# Patient Record
Sex: Female | Born: 1950 | Race: Black or African American | Hispanic: No | Marital: Single | Smoking: Never smoker
Health system: Southern US, Community
[De-identification: ages and names within clinical notes are randomized; demographics above are authoritative.]

## PROBLEM LIST (undated history)

## (undated) DIAGNOSIS — M5136 Other intervertebral disc degeneration, lumbar region: Secondary | ICD-10-CM

## (undated) DIAGNOSIS — R7989 Other specified abnormal findings of blood chemistry: Secondary | ICD-10-CM

## (undated) DIAGNOSIS — M51369 Other intervertebral disc degeneration, lumbar region without mention of lumbar back pain or lower extremity pain: Secondary | ICD-10-CM

## (undated) HISTORY — DX: Other specified abnormal findings of blood chemistry: R79.89

## (undated) HISTORY — PX: COLONOSCOPY: SHX174

## (undated) HISTORY — DX: Other intervertebral disc degeneration, lumbar region: M51.36

## (undated) HISTORY — DX: Other intervertebral disc degeneration, lumbar region without mention of lumbar back pain or lower extremity pain: M51.369

---

## 2001-02-14 ENCOUNTER — Encounter: Payer: Self-pay | Admitting: Family Medicine

## 2001-02-14 ENCOUNTER — Ambulatory Visit: Admission: RE | Admit: 2001-02-14 | Discharge: 2001-02-14 | Payer: Self-pay | Admitting: Family Medicine

## 2001-08-09 ENCOUNTER — Other Ambulatory Visit: Admission: RE | Admit: 2001-08-09 | Discharge: 2001-08-09 | Payer: Self-pay | Admitting: Family Medicine

## 2002-12-18 ENCOUNTER — Other Ambulatory Visit: Admission: RE | Admit: 2002-12-18 | Discharge: 2002-12-18 | Payer: Self-pay | Admitting: Family Medicine

## 2002-12-25 ENCOUNTER — Encounter: Admission: RE | Admit: 2002-12-25 | Discharge: 2002-12-25 | Payer: Self-pay | Admitting: Family Medicine

## 2002-12-25 ENCOUNTER — Encounter: Payer: Self-pay | Admitting: Family Medicine

## 2003-01-22 ENCOUNTER — Encounter: Payer: Self-pay | Admitting: Neurosurgery

## 2003-01-22 ENCOUNTER — Encounter: Admission: RE | Admit: 2003-01-22 | Discharge: 2003-01-22 | Payer: Self-pay | Admitting: Neurosurgery

## 2003-01-22 ENCOUNTER — Encounter: Payer: Self-pay | Admitting: Radiology

## 2003-02-13 ENCOUNTER — Encounter: Admission: RE | Admit: 2003-02-13 | Discharge: 2003-02-13 | Payer: Self-pay | Admitting: Neurosurgery

## 2003-02-13 ENCOUNTER — Encounter: Payer: Self-pay | Admitting: Neurosurgery

## 2004-07-19 ENCOUNTER — Other Ambulatory Visit: Admission: RE | Admit: 2004-07-19 | Discharge: 2004-07-19 | Payer: Self-pay | Admitting: Family Medicine

## 2004-07-19 ENCOUNTER — Ambulatory Visit: Payer: Self-pay | Admitting: Family Medicine

## 2004-08-08 ENCOUNTER — Ambulatory Visit: Payer: Self-pay | Admitting: Family Medicine

## 2004-12-16 ENCOUNTER — Ambulatory Visit: Payer: Self-pay | Admitting: Family Medicine

## 2005-07-04 ENCOUNTER — Ambulatory Visit: Payer: Self-pay | Admitting: Family Medicine

## 2006-04-17 ENCOUNTER — Other Ambulatory Visit: Admission: RE | Admit: 2006-04-17 | Discharge: 2006-04-17 | Payer: Self-pay | Admitting: *Deleted

## 2006-06-13 ENCOUNTER — Encounter: Admission: RE | Admit: 2006-06-13 | Discharge: 2006-06-13 | Payer: Self-pay | Admitting: *Deleted

## 2007-11-11 ENCOUNTER — Encounter: Admission: RE | Admit: 2007-11-11 | Discharge: 2007-11-11 | Payer: Self-pay | Admitting: *Deleted

## 2008-11-24 ENCOUNTER — Other Ambulatory Visit: Admission: RE | Admit: 2008-11-24 | Discharge: 2008-11-24 | Payer: Self-pay | Admitting: Family Medicine

## 2008-12-02 ENCOUNTER — Encounter: Admission: RE | Admit: 2008-12-02 | Discharge: 2008-12-02 | Payer: Self-pay | Admitting: Family Medicine

## 2010-05-29 ENCOUNTER — Encounter: Payer: Self-pay | Admitting: *Deleted

## 2010-11-24 ENCOUNTER — Other Ambulatory Visit (HOSPITAL_COMMUNITY)
Admission: RE | Admit: 2010-11-24 | Discharge: 2010-11-24 | Disposition: A | Discharge status: Home / Self Care | Payer: BC Managed Care – PPO | Source: Ambulatory Visit | Attending: Family Medicine | Admitting: Family Medicine

## 2010-11-24 ENCOUNTER — Other Ambulatory Visit: Payer: Self-pay

## 2010-11-24 DIAGNOSIS — Z01419 Encounter for gynecological examination (general) (routine) without abnormal findings: Secondary | ICD-10-CM | POA: Insufficient documentation

## 2010-11-28 ENCOUNTER — Other Ambulatory Visit: Payer: Self-pay | Admitting: Family Medicine

## 2010-11-28 DIAGNOSIS — Z780 Asymptomatic menopausal state: Secondary | ICD-10-CM

## 2010-11-28 DIAGNOSIS — Z1231 Encounter for screening mammogram for malignant neoplasm of breast: Secondary | ICD-10-CM

## 2015-10-05 ENCOUNTER — Other Ambulatory Visit (HOSPITAL_COMMUNITY)
Admission: RE | Admit: 2015-10-05 | Discharge: 2015-10-05 | Disposition: A | Discharge status: Home / Self Care | Payer: Managed Care, Other (non HMO) | Source: Ambulatory Visit | Attending: Family Medicine | Admitting: Family Medicine

## 2015-10-05 ENCOUNTER — Other Ambulatory Visit: Payer: Self-pay | Admitting: Family Medicine

## 2015-10-05 DIAGNOSIS — Z01419 Encounter for gynecological examination (general) (routine) without abnormal findings: Secondary | ICD-10-CM | POA: Diagnosis not present

## 2015-10-06 ENCOUNTER — Other Ambulatory Visit: Payer: Self-pay | Admitting: Family Medicine

## 2015-10-06 DIAGNOSIS — E2839 Other primary ovarian failure: Secondary | ICD-10-CM

## 2015-10-07 ENCOUNTER — Other Ambulatory Visit: Payer: Self-pay | Admitting: Family Medicine

## 2015-10-07 DIAGNOSIS — Z1231 Encounter for screening mammogram for malignant neoplasm of breast: Secondary | ICD-10-CM

## 2015-10-07 LAB — CYTOLOGY - PAP

## 2015-10-22 ENCOUNTER — Ambulatory Visit
Admission: RE | Admit: 2015-10-22 | Discharge: 2015-10-22 | Disposition: A | Discharge status: Home / Self Care | Payer: Managed Care, Other (non HMO) | Source: Ambulatory Visit | Attending: Family Medicine | Admitting: Family Medicine

## 2015-10-22 DIAGNOSIS — Z1231 Encounter for screening mammogram for malignant neoplasm of breast: Secondary | ICD-10-CM

## 2015-10-22 DIAGNOSIS — E2839 Other primary ovarian failure: Secondary | ICD-10-CM

## 2017-06-20 IMAGING — MG DIGITAL SCREENING BILATERAL MAMMOGRAM WITH CAD
4 series · 4 of 4 positions shown · non-contrast
Comparison: Previous exam(s).

CLINICAL DATA: Screening.

EXAM:
DIGITAL SCREENING BILATERAL MAMMOGRAM WITH CAD

[R MLO]
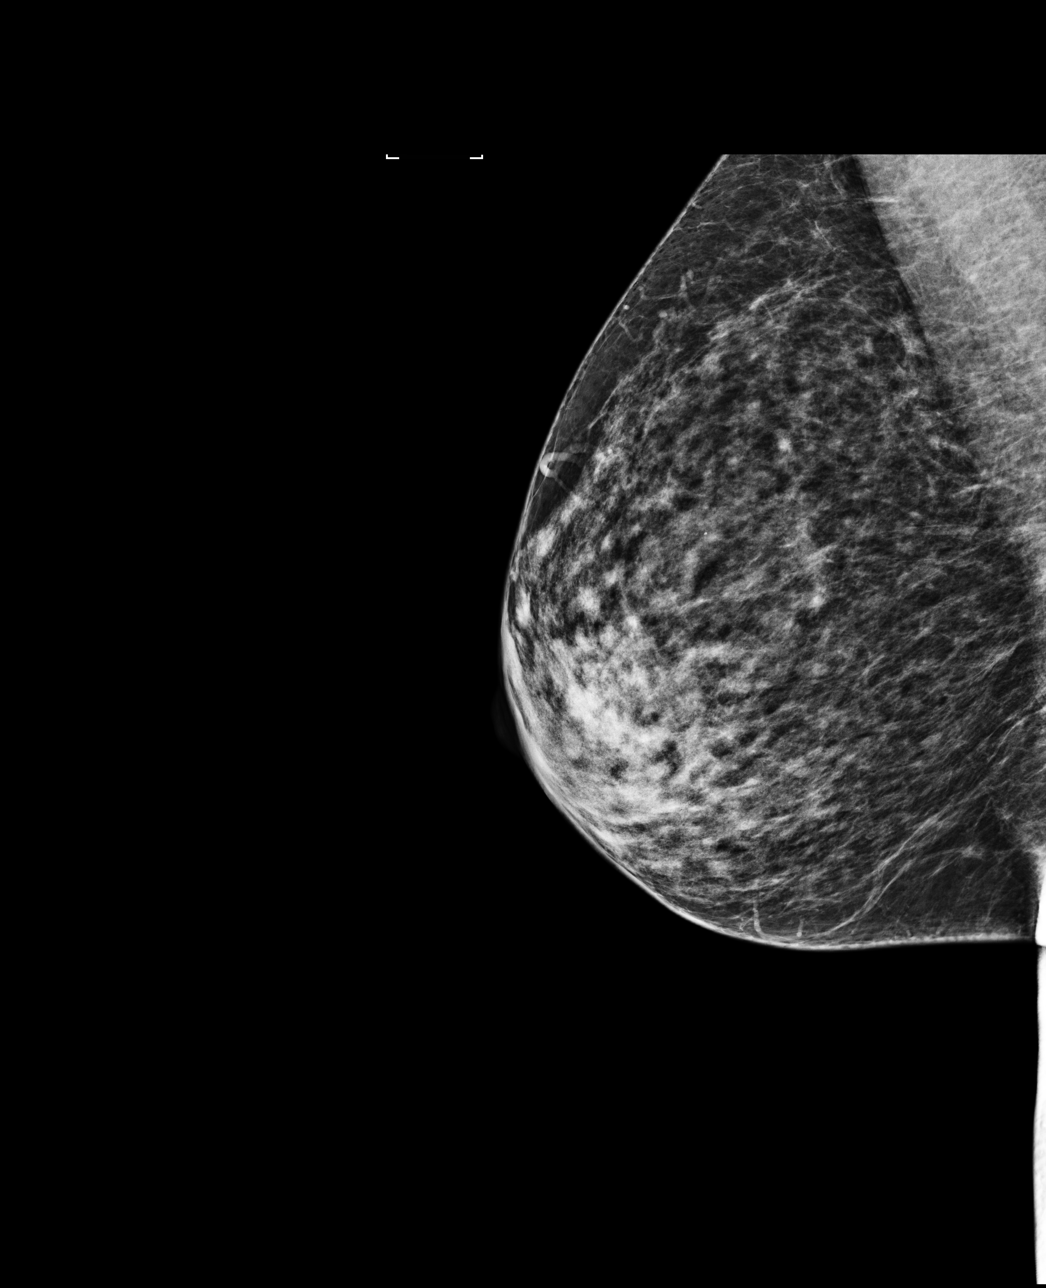

[L MLO]
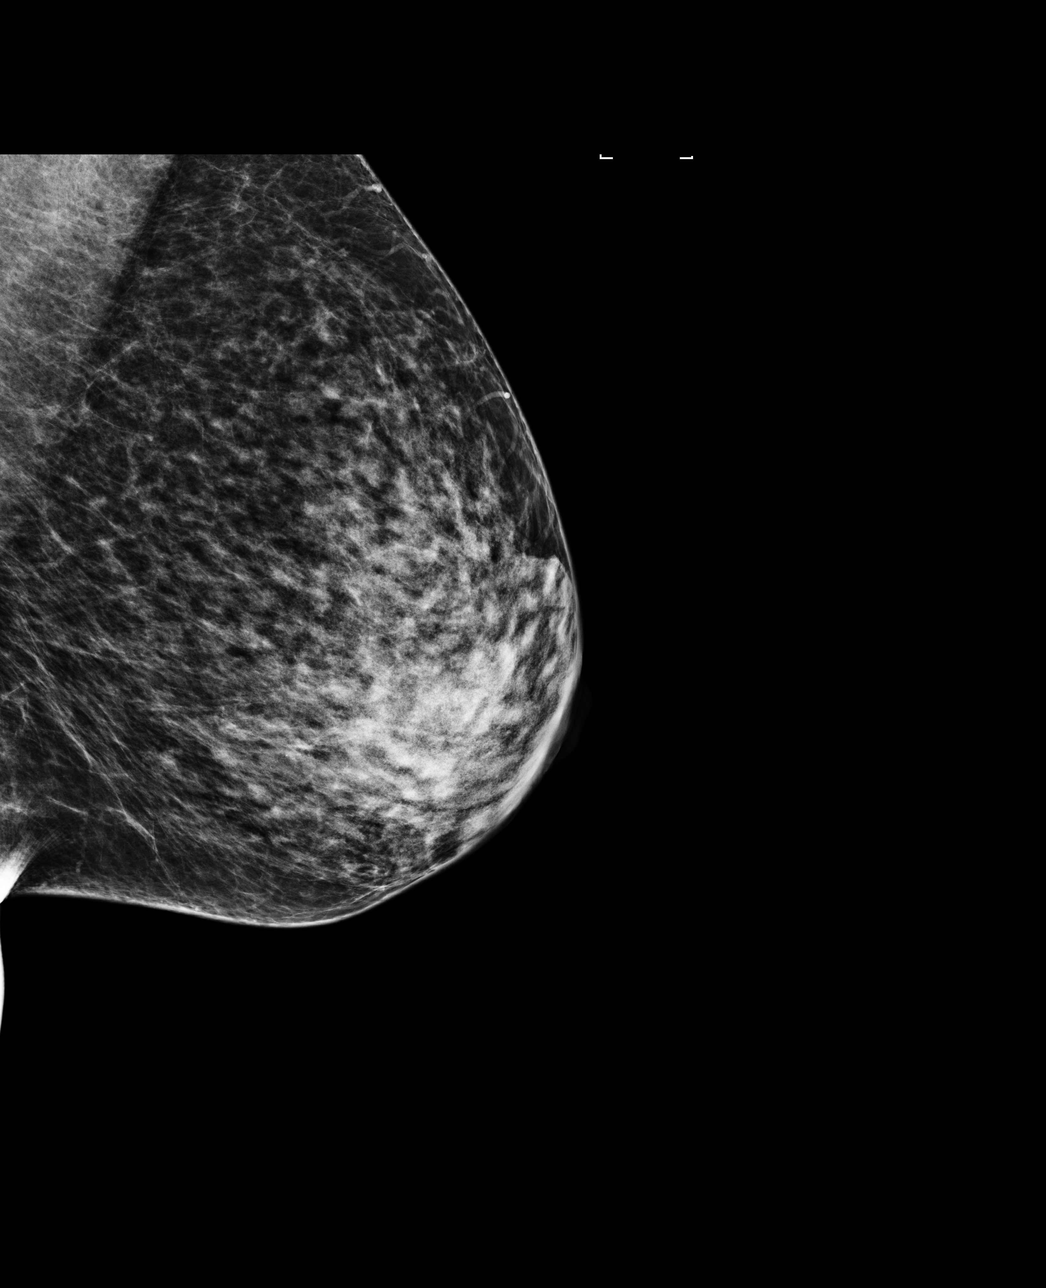

[R CC]
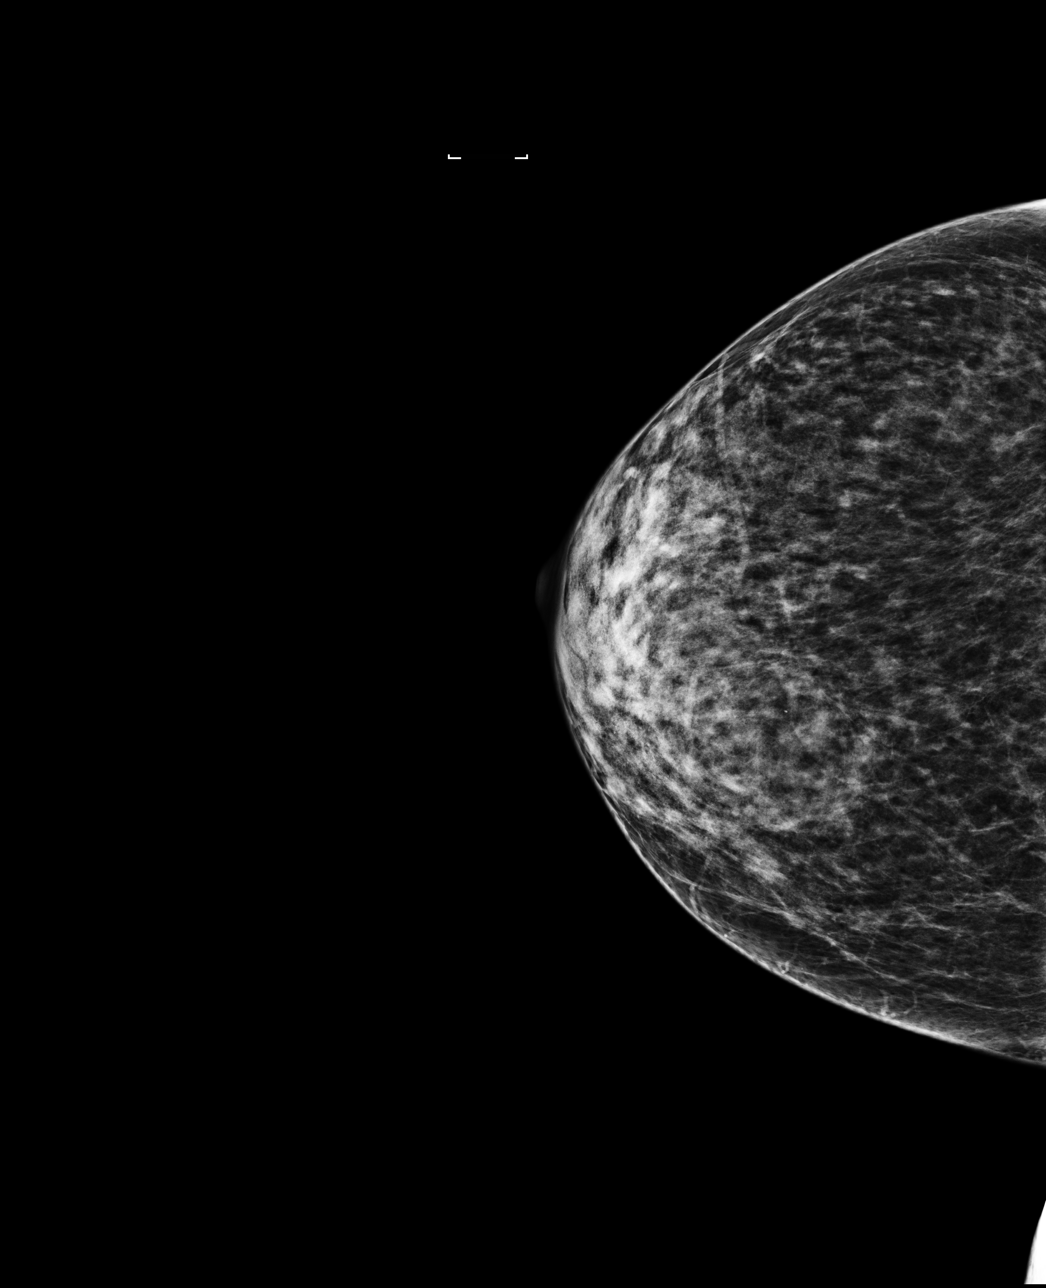

[L CC]
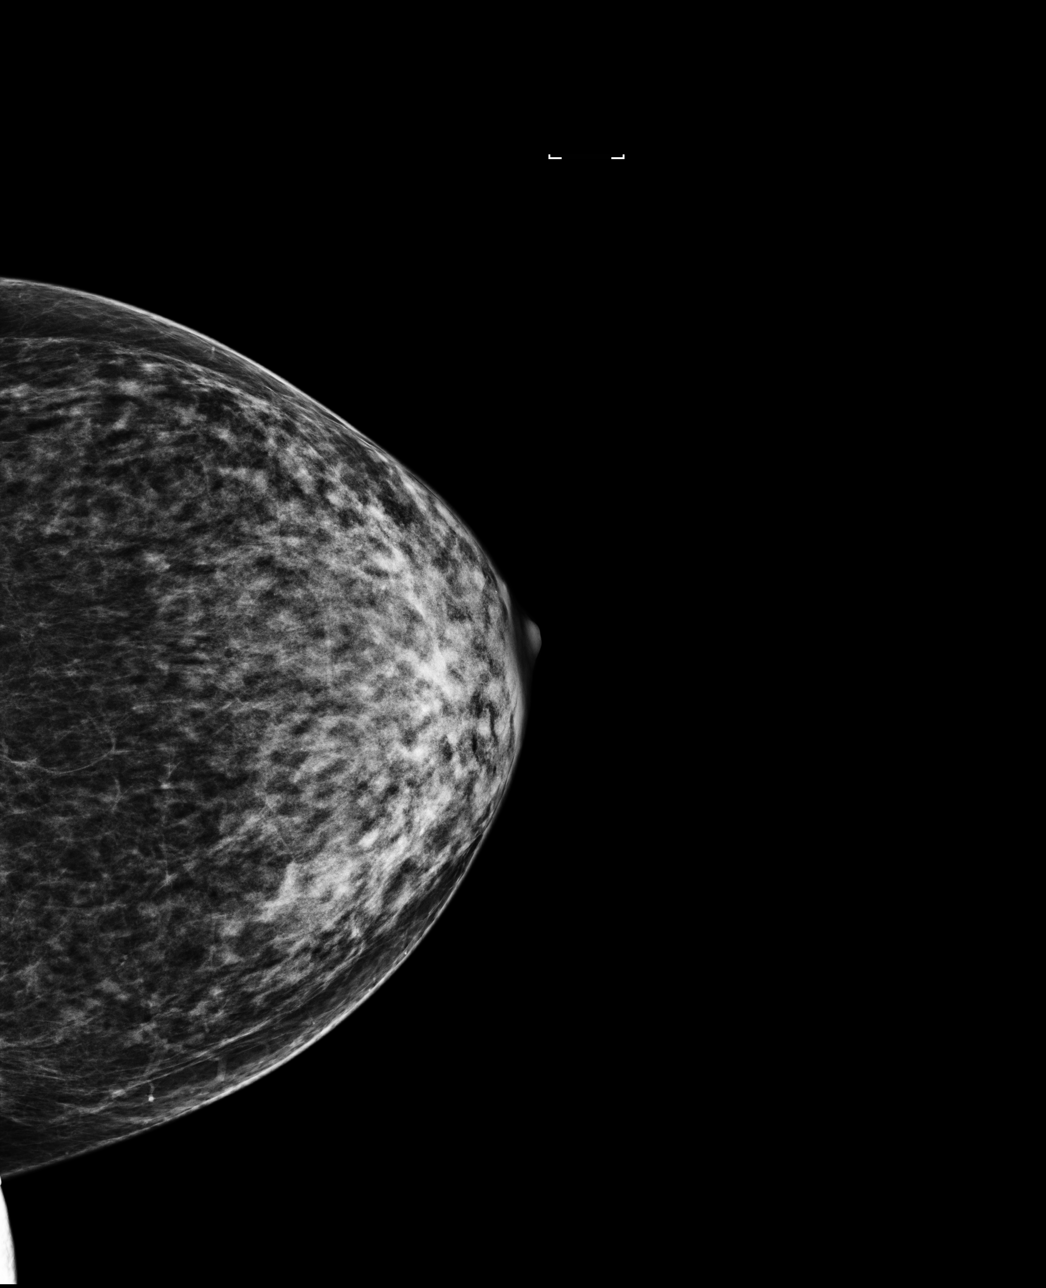

[4 of 4 positions shown; findings below may reference images not displayed]

ACR Breast Density Category c: The breast tissue is heterogeneously
dense, which may obscure small masses.
FINDINGS: There are no findings suspicious for malignancy. Images were
processed with CAD.
IMPRESSION: No mammographic evidence of malignancy. A result letter of this
screening mammogram will be mailed directly to the patient.

RECOMMENDATION:
Screening mammogram in one year. (Code:[id number])

BI-RADS CATEGORY  1: Negative.

## 2019-07-04 ENCOUNTER — Ambulatory Visit: Payer: Medicare Other | Attending: Internal Medicine

## 2019-07-04 DIAGNOSIS — Z23 Encounter for immunization: Secondary | ICD-10-CM | POA: Insufficient documentation

## 2019-07-04 NOTE — Progress Notes (Signed)
   Covid-19 Vaccination Clinic  Name:  Shelly Arnold    MRN: [id number] DOB: Dec 05, 1950  07/04/2019  [name] was observed post Covid-19 immunization for 15 minutes without incidence. She was provided with Vaccine Information Sheet and instruction to access the V-Safe system.   [name] was instructed to call 911 with any severe reactions post vaccine: Marland Kitchen Difficulty breathing  . Swelling of your face and throat  . A fast heartbeat  . A bad rash all over your body  . Dizziness and weakness    Immunizations Administered    Name Date Dose VIS Date Route   Pfizer COVID-19 Vaccine 07/04/2019  3:57 PM 0.3 mL 04/18/2019 Intramuscular   Manufacturer: [employer], [employer]   Lot: [id number]   NDC: [address]-6

## 2019-07-30 ENCOUNTER — Ambulatory Visit: Payer: Medicare Other | Attending: Internal Medicine

## 2019-07-30 DIAGNOSIS — Z23 Encounter for immunization: Secondary | ICD-10-CM

## 2019-07-30 NOTE — Progress Notes (Signed)
   Covid-19 Vaccination Clinic  Name:  Shelly Arnold    MRN: [id number] DOB: 10/17/1950  07/30/2019  [name] was observed post Covid-19 immunization for 15 minutes without incident. She was provided with Vaccine Information Sheet and instruction to access the V-Safe system.   [name] was instructed to call 911 with any severe reactions post vaccine: Marland Kitchen Difficulty breathing  . Swelling of face and throat  . A fast heartbeat  . A bad rash all over body  . Dizziness and weakness   Immunizations Administered    Name Date Dose VIS Date Route   Pfizer COVID-19 Vaccine 07/30/2019 10:18 AM 0.3 mL 04/18/2019 Intramuscular   Manufacturer: [employer], [employer]   Lot: [id number]   NDC: [address]-3

## 2019-08-05 ENCOUNTER — Other Ambulatory Visit: Payer: Self-pay | Admitting: Family Medicine

## 2019-08-05 DIAGNOSIS — Z1231 Encounter for screening mammogram for malignant neoplasm of breast: Secondary | ICD-10-CM

## 2019-09-10 ENCOUNTER — Other Ambulatory Visit: Payer: Self-pay

## 2019-09-10 ENCOUNTER — Ambulatory Visit
Admission: RE | Admit: 2019-09-10 | Discharge: 2019-09-10 | Disposition: A | Discharge status: Home / Self Care | Payer: Medicare Other | Source: Ambulatory Visit | Attending: Family Medicine | Admitting: Family Medicine

## 2019-09-10 DIAGNOSIS — Z1231 Encounter for screening mammogram for malignant neoplasm of breast: Secondary | ICD-10-CM

## 2019-09-23 ENCOUNTER — Other Ambulatory Visit: Payer: Self-pay

## 2019-09-23 ENCOUNTER — Encounter: Payer: Self-pay | Admitting: Internal Medicine

## 2019-09-23 ENCOUNTER — Ambulatory Visit (INDEPENDENT_AMBULATORY_CARE_PROVIDER_SITE_OTHER): Payer: Medicare Other | Admitting: Internal Medicine

## 2019-09-23 VITALS — BP 150/70 | HR 75 | Temp 97.4°F | Ht 62.5 in | Wt 135.7 lb

## 2019-09-23 DIAGNOSIS — Z1211 Encounter for screening for malignant neoplasm of colon: Secondary | ICD-10-CM | POA: Diagnosis not present

## 2019-09-23 DIAGNOSIS — R030 Elevated blood-pressure reading, without diagnosis of hypertension: Secondary | ICD-10-CM | POA: Diagnosis not present

## 2019-09-23 DIAGNOSIS — M5136 Other intervertebral disc degeneration, lumbar region: Secondary | ICD-10-CM | POA: Diagnosis not present

## 2019-09-23 DIAGNOSIS — M51369 Other intervertebral disc degeneration, lumbar region without mention of lumbar back pain or lower extremity pain: Secondary | ICD-10-CM | POA: Insufficient documentation

## 2019-09-23 NOTE — Patient Instructions (Signed)
-  Nice seeing you today!!  -Schedule next available appointment for your physical. Please come in fasting that day.

## 2019-09-23 NOTE — Progress Notes (Signed)
Established Patient Office Visit     This visit occurred during the SARS-CoV-2 public health emergency.  Safety protocols were in place, including screening questions prior to the visit, additional usage of staff PPE, and extensive cleaning of exam room while observing appropriate contact time as indicated for disinfecting solutions.    CC/Reason for Visit: Establish care, discuss chronic conditions  HPI: Shelly Arnold is a 69 y.o. female who is coming in today for the above mentioned reasons. 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  She also has a family history of diabetes.  She does not take any medications chronically, she has no known drug allergies, has not had any prior surgeries.  Is a never drinker, never smoker.  Family history significant for mother, maternal grandfather and several paternal aunts and uncles with diabetes as well as several cousins.  She used to see a physician with [facility].  She has had both of her Covid vaccinations.   Past Medical/Surgical History: Past Medical History:  Diagnosis Date  . DDD (degenerative disc disease), lumbar     History reviewed. No pertinent surgical history.  Social History:  reports that she has never smoked. She has never used smokeless tobacco. She reports previous alcohol use. She reports that she does not use drugs.  Allergies: Not on File  Family History:  Family History  Problem Relation Age of Onset  . Diabetes Mother   . Diabetes Maternal Grandfather   . Diabetes Paternal Uncle      Current Outpatient Medications:  .  calcium gluconate 500 MG tablet, Take 1 tablet by mouth daily., Disp: , Rfl:  .  Cyanocobalamin (B-12) 3000 MCG CAPS, Take by mouth., Disp: , Rfl:   Review of Systems:  Constitutional: Denies fever, chills, diaphoresis, appetite change and fatigue.  HEENT: Denies photophobia, eye pain, redness,  hearing loss, ear pain, congestion, sore throat, rhinorrhea, sneezing, mouth sores, trouble swallowing, neck pain, neck stiffness and tinnitus.   Respiratory: Denies SOB, DOE, cough, chest tightness,  and wheezing.   Cardiovascular: Denies chest pain, palpitations and leg swelling.  Gastrointestinal: Denies nausea, vomiting, abdominal pain, diarrhea, constipation, blood in stool and abdominal distention.  Genitourinary: Denies dysuria, urgency, frequency, hematuria, flank pain and difficulty urinating.  Endocrine: Denies: hot or cold intolerance, sweats, changes in hair or nails, polyuria, polydipsia. Musculoskeletal: Denies myalgias, back pain, joint swelling, arthralgias and gait problem.  Skin: Denies pallor, rash and wound.  Neurological: Denies dizziness, seizures, syncope, weakness, light-headedness, numbness and headaches.  Hematological: Denies adenopathy. Easy bruising, personal or family bleeding history  Psychiatric/Behavioral: Denies suicidal ideation, mood changes, confusion, nervousness, sleep disturbance and agitation    Physical Exam: Vitals:   09/23/19 1135  BP: (!) 150/70  Pulse: 75  Temp: (!) 97.4 F (36.3 C)  TempSrc: Temporal  SpO2: 96%  Weight: 135 lb 11.2 oz (61.6 kg)  Height: 5' 2.5" (1.588 m)    Body mass index is 24.42 kg/m.   Constitutional: NAD, calm, comfortable Eyes: PERRL, lids and conjunctivae normal, wears corrective lenses ENMT: Mucous membranes are moist.  Respiratory: clear to auscultation bilaterally, no wheezing, no crackles. Normal respiratory effort. No accessory muscle use.  Cardiovascular: Regular rate and rhythm, no murmurs / rubs / gallops. No extremity edema.  Neurologic: Grossly intact and nonfocal Psychiatric: Normal judgment and insight. Alert and oriented x 3. Normal mood.    Impression and Plan:  Screening for malignant  neoplasm of colon  - Plan: Ambulatory referral to Gastroenterology  DDD (degenerative disc disease),  lumbar -Stable, she has occasional flareups.  Elevated BP without diagnosis of hypertension -Blood pressure elevated to 150/70 today, not known to be hypertensive. -She will do ambulatory blood pressure monitoring and will return for follow-up.    Patient Instructions  -Nice seeing you today!!  -Schedule next available appointment for your physical. Please come in fasting that day.     Chaya Jan, MD [facility] at [facility]

## 2019-09-25 ENCOUNTER — Encounter: Payer: Self-pay | Admitting: Internal Medicine

## 2019-10-02 ENCOUNTER — Encounter: Payer: Self-pay | Admitting: Internal Medicine

## 2019-10-02 ENCOUNTER — Other Ambulatory Visit: Payer: Self-pay

## 2019-10-02 ENCOUNTER — Ambulatory Visit (INDEPENDENT_AMBULATORY_CARE_PROVIDER_SITE_OTHER): Payer: Medicare Other | Admitting: Internal Medicine

## 2019-10-02 VITALS — BP 140/90 | HR 71 | Temp 97.3°F | Ht 62.0 in | Wt 134.6 lb

## 2019-10-02 DIAGNOSIS — Z0000 Encounter for general adult medical examination without abnormal findings: Secondary | ICD-10-CM | POA: Diagnosis not present

## 2019-10-02 DIAGNOSIS — R030 Elevated blood-pressure reading, without diagnosis of hypertension: Secondary | ICD-10-CM

## 2019-10-02 DIAGNOSIS — M5136 Other intervertebral disc degeneration, lumbar region: Secondary | ICD-10-CM

## 2019-10-02 LAB — COMPREHENSIVE METABOLIC PANEL
ALT: 8 U/L (ref 0–35)
AST: 17 U/L (ref 0–37)
Albumin: 4.7 g/dL (ref 3.5–5.2)
Alkaline Phosphatase: 62 U/L (ref 39–117)
BUN: 12 mg/dL (ref 6–23)
CO2: 26 mEq/L (ref 19–32)
Calcium: 9.4 mg/dL (ref 8.4–10.5)
Chloride: 104 mEq/L (ref 96–112)
Creatinine, Ser: 0.73 mg/dL (ref 0.40–1.20)
GFR: 95.31 mL/min (ref >60.00)
Glucose, Bld: 95 mg/dL (ref 70–99)
Potassium: 4.6 mEq/L (ref 3.5–5.1)
Sodium: 139 mEq/L (ref 135–145)
Total Bilirubin: 0.4 mg/dL (ref 0.2–1.2)
Total Protein: 7.3 g/dL (ref 6.0–8.3)

## 2019-10-02 LAB — CBC WITH DIFFERENTIAL/PLATELET
Basophils Absolute: 0 10*3/uL (ref 0.0–0.1)
Basophils Relative: 0.4 % (ref 0.0–3.0)
Eosinophils Absolute: 0 10*3/uL (ref 0.0–0.7)
Eosinophils Relative: 0 % (ref 0.0–5.0)
HCT: 39 % (ref 36.0–46.0)
Hemoglobin: 12.6 g/dL (ref 12.0–15.0)
Lymphocytes Relative: 27.9 % (ref 12.0–46.0)
Lymphs Abs: 1.6 10*3/uL (ref 0.7–4.0)
MCHC: 32.2 g/dL (ref 30.0–36.0)
MCV: 80.4 fl (ref 78.0–100.0)
Monocytes Absolute: 0.5 10*3/uL (ref 0.1–1.0)
Monocytes Relative: 9.1 % (ref 3.0–12.0)
Neutro Abs: 3.5 10*3/uL (ref 1.4–7.7)
Neutrophils Relative %: 62.6 % (ref 43.0–77.0)
Platelets: 198 10*3/uL (ref 150.0–400.0)
RBC: 4.85 Mil/uL (ref 3.87–5.11)
RDW: 13.9 % (ref 11.5–15.5)
WBC: 5.6 10*3/uL (ref 4.0–10.5)

## 2019-10-02 LAB — LIPID PANEL
Cholesterol: 202 mg/dL — ABNORMAL HIGH (ref 0–200)
HDL: 95.7 mg/dL (ref >39.00)
LDL Cholesterol: 99 mg/dL (ref 0–99)
NonHDL: 106.4
Total CHOL/HDL Ratio: 2
Triglycerides: 38 mg/dL (ref 0.0–149.0)
VLDL: 7.6 mg/dL (ref 0.0–40.0)

## 2019-10-02 LAB — HEMOGLOBIN A1C: Hgb A1c MFr Bld: 5.9 % (ref 4.6–6.5)

## 2019-10-02 LAB — VITAMIN B12: Vitamin B-12: 421 pg/mL (ref 211–911)

## 2019-10-02 LAB — VITAMIN D 25 HYDROXY (VIT D DEFICIENCY, FRACTURES): VITD: 16.03 ng/mL — ABNORMAL LOW (ref 30.00–100.00)

## 2019-10-02 LAB — TSH: TSH: 0.83 u[IU]/mL (ref 0.35–4.50)

## 2019-10-02 NOTE — Progress Notes (Signed)
Established Patient Office Visit     This visit occurred during the SARS-CoV-2 public health emergency.  Safety protocols were in place, including screening questions prior to the visit, additional usage of staff PPE, and extensive cleaning of exam room while observing appropriate contact time as indicated for disinfecting solutions.    CC/Reason for Visit: Annual preventive exam and subsequent Medicare wellness visit  HPI: Shelly Arnold is a 69 y.o. female who is coming in today for the above mentioned reasons. Past Medical History is significant for: Low back degenerative disc disease with a prior history of sciatica, not active today.  She is again noted to have high blood pressure in the office today but has never been diagnosed with hypertension.  She has her eye and dental exam scheduled for June.  She just had her mammogram, she has her colonoscopy scheduled for July.  She is quite active.  She had both of her Covid vaccines.  She has no acute issues today.   Past Medical/Surgical History: Past Medical History:  Diagnosis Date  . DDD (degenerative disc disease), lumbar     No past surgical history on file.  Social History:  reports that she has never smoked. She has never used smokeless tobacco. She reports previous alcohol use. She reports that she does not use drugs.  Allergies: No Known Allergies  Family History:  Family History  Problem Relation Age of Onset  . Diabetes Mother   . Diabetes Maternal Grandfather   . Diabetes Paternal Uncle      Current Outpatient Medications:  .  calcium gluconate 500 MG tablet, Take 1 tablet by mouth daily., Disp: , Rfl:  .  Cyanocobalamin (B-12) 3000 MCG CAPS, Take by mouth., Disp: , Rfl:   Review of Systems:  Constitutional: Denies fever, chills, diaphoresis, appetite change and fatigue.  HEENT: Denies photophobia, eye pain, redness, hearing loss, ear pain, congestion, sore throat, rhinorrhea, sneezing, mouth sores,  trouble swallowing, neck pain, neck stiffness and tinnitus.   Respiratory: Denies SOB, DOE, cough, chest tightness,  and wheezing.   Cardiovascular: Denies chest pain, palpitations and leg swelling.  Gastrointestinal: Denies nausea, vomiting, abdominal pain, diarrhea, constipation, blood in stool and abdominal distention.  Genitourinary: Denies dysuria, urgency, frequency, hematuria, flank pain and difficulty urinating.  Endocrine: Denies: hot or cold intolerance, sweats, changes in hair or nails, polyuria, polydipsia. Musculoskeletal: Denies myalgias, back pain, joint swelling, arthralgias and gait problem.  Skin: Denies pallor, rash and wound.  Neurological: Denies dizziness, seizures, syncope, weakness, light-headedness, numbness and headaches.  Hematological: Denies adenopathy. Easy bruising, personal or family bleeding history  Psychiatric/Behavioral: Denies suicidal ideation, mood changes, confusion, nervousness, sleep disturbance and agitation    Physical Exam: Vitals:   10/02/19 1100  BP: 140/90  Pulse: 71  Temp: (!) 97.3 F (36.3 C)  TempSrc: Temporal  SpO2: 97%  Weight: 134 lb 9.6 oz (61.1 kg)  Height: 5' 2"  (1.575 m)    Body mass index is 24.62 kg/m.   Constitutional: NAD, calm, comfortable Eyes: PERRL, lids and conjunctivae normal, wears corrective lenses ENMT: Mucous membranes are moist. Tympanic membrane is pearly white, no erythema or bulging. Neck: normal, supple, no masses, no thyromegaly Respiratory: clear to auscultation bilaterally, no wheezing, no crackles. Normal respiratory effort. No accessory muscle use.  Cardiovascular: Regular rate and rhythm, no murmurs / rubs / gallops. No extremity edema. 2+ pedal pulses. No carotid bruits.  Abdomen: no tenderness, no masses palpated. No hepatosplenomegaly. Bowel sounds positive.  Musculoskeletal:  no clubbing / cyanosis. No joint deformity upper and lower extremities. Good ROM, no contractures. Normal muscle tone.    Skin: no rashes, lesions, ulcers. No induration Neurologic: CN 2-12 grossly intact. Sensation intact, DTR normal. Strength 5/5 in all 4.  Psychiatric: Normal judgment and insight. Alert and oriented x 3. Normal mood.    Subsequent Medicare wellness visit   1. Risk factors, based on past  M,S,F -cardiovascular disease risk factors age only   2.  Physical activities: She walks every day   3.  Depression/mood:  Stable, not depressed   4.  Hearing:  No perceived issues   5.  ADL's: Independent in all ADLs   6.  Fall risk:  Low fall risk   7.  Home safety: No problems identified   8.  Height weight, and visual acuity: Height and weight as above, visual acuity is 20/32 with the right, 20/25 with the left and 20/32 together   9.  Counseling:  Advise shingles and tetanus vaccines at pharmacy.   10. Lab orders based on risk factors: Laboratory update will be reviewed   11. Referral :  None today   12. Care plan:  Follow-up with me in 3 months for blood pressure   13. Cognitive assessment:  No cognitive impairment   14. Screening: Patient provided with a written and personalized 5-10 year screening schedule in the AVS.   yes   15. Provider List Update:   PCP only  16. Advance Directives: Full code     Office Visit from 09/23/2019 in [place] at [facility]  PHQ-9 Total Score  3      Fall Risk  09/23/2019  Falls in the past year? 1  Number falls in past yr: 0  Injury with Fall? 0     Impression and Plan:  Encounter for preventive health examination  -Advised routine eye and dental care. -Will obtain tetanus and shingles vaccines at pharmacy, otherwise is up-to-date. -Screening labs today. -Healthy lifestyle discussed in detail. -Had a mammogram in May that was negative. -Has colonoscopy scheduled for July. -She already had follow-up with GYN.  DDD (degenerative disc disease), lumbar -Noted, no pain currently.  Elevated BP without diagnosis of  hypertension -Second in office visit noted to have elevated blood pressure. -She will purchase blood pressure cuff and do ambulatory monitoring and return in 3 months for follow-up.    Patient Instructions  -Nice seeing you today!!  -Lab work today; will notify you once results are available.  -Remember your tetanus booster and shingles vaccines at your pharmacy.  -Check your blood pressure at home 3 times a week.  -Schedule follow up in 3 months.   Preventive Care 16 Years and Older, Female Preventive care refers to lifestyle choices and visits with your health care provider that can promote health and wellness. This includes:  A yearly physical exam. This is also called an annual well check.  Regular dental and eye exams.  Immunizations.  Screening for certain conditions.  Healthy lifestyle choices, such as diet and exercise. What can I expect for my preventive care visit? Physical exam Your health care provider will check:  Height and weight. These may be used to calculate body mass index (BMI), which is a measurement that tells if you are at a healthy weight.  Heart rate and blood pressure.  Your skin for abnormal spots. Counseling Your health care provider may ask you questions about:  Alcohol, tobacco, and drug use.  Emotional well-being.  Home  and relationship well-being.  Sexual activity.  Eating habits.  History of falls.  Memory and ability to understand (cognition).  Work and work Statistician.  Pregnancy and menstrual history. What immunizations do I need?  Influenza (flu) vaccine  This is recommended every year. Tetanus, diphtheria, and pertussis (Tdap) vaccine  You may need a Td booster every 10 years. Varicella (chickenpox) vaccine  You may need this vaccine if you have not already been vaccinated. Zoster (shingles) vaccine  You may need this after age 55. Pneumococcal conjugate (PCV13) vaccine  One dose is recommended after age  6. Pneumococcal polysaccharide (PPSV23) vaccine  One dose is recommended after age 74. Measles, mumps, and rubella (MMR) vaccine  You may need at least one dose of MMR if you were born in 1957 or later. You may also need a second dose. Meningococcal conjugate (MenACWY) vaccine  You may need this if you have certain conditions. Hepatitis A vaccine  You may need this if you have certain conditions or if you travel or work in places where you may be exposed to hepatitis A. Hepatitis B vaccine  You may need this if you have certain conditions or if you travel or work in places where you may be exposed to hepatitis B. Haemophilus influenzae type b (Hib) vaccine  You may need this if you have certain conditions. You may receive vaccines as individual doses or as more than one vaccine together in one shot (combination vaccines). Talk with your health care provider about the risks and benefits of combination vaccines. What tests do I need? Blood tests  Lipid and cholesterol levels. These may be checked every 5 years, or more frequently depending on your overall health.  Hepatitis C test.  Hepatitis B test. Screening  Lung cancer screening. You may have this screening every year starting at age 40 if you have a 30-pack-year history of smoking and currently smoke or have quit within the past 15 years.  Colorectal cancer screening. All adults should have this screening starting at age 27 and continuing until age 45. Your health care provider may recommend screening at age 32 if you are at increased risk. You will have tests every 1-10 years, depending on your results and the type of screening test.  Diabetes screening. This is done by checking your blood sugar (glucose) after you have not eaten for a while (fasting). You may have this done every 1-3 years.  Mammogram. This may be done every 1-2 years. Talk with your health care provider about how often you should have regular  mammograms.  BRCA-related cancer screening. This may be done if you have a family history of breast, ovarian, tubal, or peritoneal cancers. Other tests  Sexually transmitted disease (STD) testing.  Bone density scan. This is done to screen for osteoporosis. You may have this done starting at age 37. Follow these instructions at home: Eating and drinking  Eat a diet that includes fresh fruits and vegetables, whole grains, lean protein, and low-fat dairy products. Limit your intake of foods with high amounts of sugar, saturated fats, and salt.  Take vitamin and mineral supplements as recommended by your health care provider.  Do not drink alcohol if your health care provider tells you not to drink.  If you drink alcohol: ? Limit how much you have to 0-1 drink a day. ? Be aware of how much alcohol is in your drink. In the [place] one drink equals one 12 oz bottle of beer (355 mL), one 5  oz glass of wine (148 mL), or one 1 oz glass of hard liquor (44 mL). Lifestyle  Take daily care of your teeth and gums.  Stay active. Exercise for at least 30 minutes on 5 or more days each week.  Do not use any products that contain nicotine or tobacco, such as cigarettes, e-cigarettes, and chewing tobacco. If you need help quitting, ask your health care provider.  If you are sexually active, practice safe sex. Use a condom or other form of protection in order to prevent STIs (sexually transmitted infections).  Talk with your health care provider about taking a low-dose aspirin or statin. What's next?  Go to your health care provider once a year for a well check visit.  Ask your health care provider how often you should have your eyes and teeth checked.  Stay up to date on all vaccines. This information is not intended to replace advice given to you by your health care provider. Make sure you discuss any questions you have with your health care provider. Document Revised: 04/18/2018 Document  Reviewed: 04/18/2018 Elsevier Patient Education  2020 [place], MD [facility] at [facility]

## 2019-10-02 NOTE — Patient Instructions (Signed)
-Nice seeing you today!!  -Lab work today; will notify you once results are available.  -Remember your tetanus booster and shingles vaccines at your pharmacy.  -Check your blood pressure at home 3 times a week.  -Schedule follow up in 3 months.   Preventive Care 70 Years and Older, Female Preventive care refers to lifestyle choices and visits with your health care provider that can promote health and wellness. This includes:  A yearly physical exam. This is also called an annual well check.  Regular dental and eye exams.  Immunizations.  Screening for certain conditions.  Healthy lifestyle choices, such as diet and exercise. What can I expect for my preventive care visit? Physical exam Your health care provider will check:  Height and weight. These may be used to calculate body mass index (BMI), which is a measurement that tells if you are at a healthy weight.  Heart rate and blood pressure.  Your skin for abnormal spots. Counseling Your health care provider may ask you questions about:  Alcohol, tobacco, and drug use.  Emotional well-being.  Home and relationship well-being.  Sexual activity.  Eating habits.  History of falls.  Memory and ability to understand (cognition).  Work and work Statistician.  Pregnancy and menstrual history. What immunizations do I need?  Influenza (flu) vaccine  This is recommended every year. Tetanus, diphtheria, and pertussis (Tdap) vaccine  You may need a Td booster every 10 years. Varicella (chickenpox) vaccine  You may need this vaccine if you have not already been vaccinated. Zoster (shingles) vaccine  You may need this after age 70. Pneumococcal conjugate (PCV13) vaccine  One dose is recommended after age 15. Pneumococcal polysaccharide (PPSV23) vaccine  One dose is recommended after age 70. Measles, mumps, and rubella (MMR) vaccine  You may need at least one dose of MMR if you were born in 1957 or later. You  may also need a second dose. Meningococcal conjugate (MenACWY) vaccine  You may need this if you have certain conditions. Hepatitis A vaccine  You may need this if you have certain conditions or if you travel or work in places where you may be exposed to hepatitis A. Hepatitis B vaccine  You may need this if you have certain conditions or if you travel or work in places where you may be exposed to hepatitis B. Haemophilus influenzae type b (Hib) vaccine  You may need this if you have certain conditions. You may receive vaccines as individual doses or as more than one vaccine together in one shot (combination vaccines). Talk with your health care provider about the risks and benefits of combination vaccines. What tests do I need? Blood tests  Lipid and cholesterol levels. These may be checked every 5 years, or more frequently depending on your overall health.  Hepatitis C test.  Hepatitis B test. Screening  Lung cancer screening. You may have this screening every year starting at age 70 if you have a 30-pack-year history of smoking and currently smoke or have quit within the past 15 years.  Colorectal cancer screening. All adults should have this screening starting at age 70 and continuing until age 74. Your health care provider may recommend screening at age 70 if you are at increased risk. You will have tests every 1-10 years, depending on your results and the type of screening test.  Diabetes screening. This is done by checking your blood sugar (glucose) after you have not eaten for a while (fasting). You may have this done every  1-3 years.  Mammogram. This may be done every 1-2 years. Talk with your health care provider about how often you should have regular mammograms.  BRCA-related cancer screening. This may be done if you have a family history of breast, ovarian, tubal, or peritoneal cancers. Other tests  Sexually transmitted disease (STD) testing.  Bone density scan. This  is done to screen for osteoporosis. You may have this done starting at age 70. Follow these instructions at home: Eating and drinking  Eat a diet that includes fresh fruits and vegetables, whole grains, lean protein, and low-fat dairy products. Limit your intake of foods with high amounts of sugar, saturated fats, and salt.  Take vitamin and mineral supplements as recommended by your health care provider.  Do not drink alcohol if your health care provider tells you not to drink.  If you drink alcohol: ? Limit how much you have to 0-1 drink a day. ? Be aware of how much alcohol is in your drink. In the [place] one drink equals one 12 oz bottle of beer (355 mL), one 5 oz glass of wine (148 mL), or one 1 oz glass of hard liquor (44 mL). Lifestyle  Take daily care of your teeth and gums.  Stay active. Exercise for at least 30 minutes on 5 or more days each week.  Do not use any products that contain nicotine or tobacco, such as cigarettes, e-cigarettes, and chewing tobacco. If you need help quitting, ask your health care provider.  If you are sexually active, practice safe sex. Use a condom or other form of protection in order to prevent STIs (sexually transmitted infections).  Talk with your health care provider about taking a low-dose aspirin or statin. What's next?  Go to your health care provider once a year for a well check visit.  Ask your health care provider how often you should have your eyes and teeth checked.  Stay up to date on all vaccines. This information is not intended to replace advice given to you by your health care provider. Make sure you discuss any questions you have with your health care provider. Document Revised: 04/18/2018 Document Reviewed: 04/18/2018 Elsevier Patient Education  2020 [employer].

## 2019-10-03 ENCOUNTER — Encounter: Payer: Self-pay | Admitting: Internal Medicine

## 2019-10-03 ENCOUNTER — Other Ambulatory Visit: Payer: Self-pay | Admitting: Internal Medicine

## 2019-10-03 DIAGNOSIS — E559 Vitamin D deficiency, unspecified: Secondary | ICD-10-CM

## 2019-10-03 MED ORDER — VITAMIN D (ERGOCALCIFEROL) 1.25 MG (50000 UNIT) PO CAPS: 50000.0000 [IU] | ORAL_CAPSULE | ORAL | 0 refills | Status: AC

## 2019-11-14 ENCOUNTER — Encounter: Payer: Medicare Other | Admitting: Internal Medicine

## 2019-12-08 NOTE — Addendum Note (Signed)
Addended by: Lerry Liner on: 12/08/2019 03:41 PM   Modules accepted: Orders

## 2019-12-31 ENCOUNTER — Other Ambulatory Visit: Payer: Self-pay

## 2019-12-31 ENCOUNTER — Other Ambulatory Visit: Payer: Medicare Other

## 2019-12-31 DIAGNOSIS — E559 Vitamin D deficiency, unspecified: Secondary | ICD-10-CM

## 2019-12-31 LAB — VITAMIN D 25 HYDROXY (VIT D DEFICIENCY, FRACTURES): Vit D, 25-Hydroxy: 12 ng/mL — ABNORMAL LOW (ref 30–100)

## 2020-01-07 ENCOUNTER — Other Ambulatory Visit: Payer: Self-pay | Admitting: Internal Medicine

## 2020-01-07 DIAGNOSIS — E559 Vitamin D deficiency, unspecified: Secondary | ICD-10-CM

## 2020-01-07 MED ORDER — VITAMIN D (ERGOCALCIFEROL) 1.25 MG (50000 UNIT) PO CAPS: 50000.0000 [IU] | ORAL_CAPSULE | ORAL | 0 refills | Status: AC

## 2020-01-14 ENCOUNTER — Other Ambulatory Visit: Payer: Self-pay | Admitting: Internal Medicine

## 2020-01-14 DIAGNOSIS — E559 Vitamin D deficiency, unspecified: Secondary | ICD-10-CM

## 2020-09-20 ENCOUNTER — Other Ambulatory Visit: Payer: Self-pay | Admitting: Internal Medicine

## 2020-09-20 DIAGNOSIS — Z1231 Encounter for screening mammogram for malignant neoplasm of breast: Secondary | ICD-10-CM

## 2020-10-23 DIAGNOSIS — H43393 Other vitreous opacities, bilateral: Secondary | ICD-10-CM | POA: Diagnosis not present

## 2020-10-25 ENCOUNTER — Telehealth: Payer: Self-pay | Admitting: Internal Medicine

## 2020-10-25 NOTE — Telephone Encounter (Signed)
Pt needs to r/s 07/15 appt--provider out of office.  Okay to place appt in any time slot.

## 2020-11-17 ENCOUNTER — Other Ambulatory Visit: Payer: Self-pay

## 2020-11-17 ENCOUNTER — Ambulatory Visit
Admission: RE | Admit: 2020-11-17 | Discharge: 2020-11-17 | Disposition: A | Discharge status: Home / Self Care | Payer: Medicare Other | Source: Ambulatory Visit | Attending: Internal Medicine | Admitting: Internal Medicine

## 2020-11-17 DIAGNOSIS — Z1231 Encounter for screening mammogram for malignant neoplasm of breast: Secondary | ICD-10-CM | POA: Diagnosis not present

## 2020-11-19 ENCOUNTER — Encounter: Payer: Medicare Other | Admitting: Internal Medicine

## 2020-11-24 ENCOUNTER — Other Ambulatory Visit: Payer: Self-pay

## 2020-11-25 ENCOUNTER — Encounter: Payer: Self-pay | Admitting: Internal Medicine

## 2020-11-25 ENCOUNTER — Ambulatory Visit (INDEPENDENT_AMBULATORY_CARE_PROVIDER_SITE_OTHER): Payer: Medicare Other | Admitting: Internal Medicine

## 2020-11-25 ENCOUNTER — Other Ambulatory Visit: Payer: Self-pay | Admitting: Internal Medicine

## 2020-11-25 VITALS — BP 130/80 | HR 83 | Temp 97.9°F | Ht 62.0 in | Wt 135.0 lb

## 2020-11-25 DIAGNOSIS — Z780 Asymptomatic menopausal state: Secondary | ICD-10-CM

## 2020-11-25 DIAGNOSIS — Z1211 Encounter for screening for malignant neoplasm of colon: Secondary | ICD-10-CM

## 2020-11-25 DIAGNOSIS — Z13820 Encounter for screening for osteoporosis: Secondary | ICD-10-CM

## 2020-11-25 DIAGNOSIS — R7302 Impaired glucose tolerance (oral): Secondary | ICD-10-CM | POA: Insufficient documentation

## 2020-11-25 DIAGNOSIS — Z0000 Encounter for general adult medical examination without abnormal findings: Secondary | ICD-10-CM

## 2020-11-25 DIAGNOSIS — Z23 Encounter for immunization: Secondary | ICD-10-CM

## 2020-11-25 DIAGNOSIS — E7800 Pure hypercholesterolemia, unspecified: Secondary | ICD-10-CM | POA: Diagnosis not present

## 2020-11-25 DIAGNOSIS — R7303 Prediabetes: Secondary | ICD-10-CM | POA: Diagnosis not present

## 2020-11-25 DIAGNOSIS — E559 Vitamin D deficiency, unspecified: Secondary | ICD-10-CM

## 2020-11-25 LAB — CBC WITH DIFFERENTIAL/PLATELET
Basophils Absolute: 0 10*3/uL (ref 0.0–0.1)
Basophils Relative: 0.3 % (ref 0.0–3.0)
Eosinophils Absolute: 0 10*3/uL (ref 0.0–0.7)
Eosinophils Relative: 0 % (ref 0.0–5.0)
HCT: 38.6 % (ref 36.0–46.0)
Hemoglobin: 12.6 g/dL (ref 12.0–15.0)
Lymphocytes Relative: 25.9 % (ref 12.0–46.0)
Lymphs Abs: 1.2 10*3/uL (ref 0.7–4.0)
MCHC: 32.6 g/dL (ref 30.0–36.0)
MCV: 80.3 fl (ref 78.0–100.0)
Monocytes Absolute: 0.4 10*3/uL (ref 0.1–1.0)
Monocytes Relative: 9.8 % (ref 3.0–12.0)
Neutro Abs: 2.9 10*3/uL (ref 1.4–7.7)
Neutrophils Relative %: 64 % (ref 43.0–77.0)
Platelets: 204 10*3/uL (ref 150.0–400.0)
RBC: 4.81 Mil/uL (ref 3.87–5.11)
RDW: 14.3 % (ref 11.5–15.5)
WBC: 4.5 10*3/uL (ref 4.0–10.5)

## 2020-11-25 LAB — COMPREHENSIVE METABOLIC PANEL
ALT: 7 U/L (ref 0–35)
AST: 16 U/L (ref 0–37)
Albumin: 4.7 g/dL (ref 3.5–5.2)
Alkaline Phosphatase: 61 U/L (ref 39–117)
BUN: 13 mg/dL (ref 6–23)
CO2: 26 mEq/L (ref 19–32)
Calcium: 9.3 mg/dL (ref 8.4–10.5)
Chloride: 102 mEq/L (ref 96–112)
Creatinine, Ser: 0.86 mg/dL (ref 0.40–1.20)
GFR: 68 mL/min (ref >60.00)
Glucose, Bld: 102 mg/dL — ABNORMAL HIGH (ref 70–99)
Potassium: 4.1 mEq/L (ref 3.5–5.1)
Sodium: 138 mEq/L (ref 135–145)
Total Bilirubin: 0.5 mg/dL (ref 0.2–1.2)
Total Protein: 7.3 g/dL (ref 6.0–8.3)

## 2020-11-25 LAB — LIPID PANEL
Cholesterol: 209 mg/dL — ABNORMAL HIGH (ref 0–200)
HDL: 93 mg/dL (ref >39.00)
LDL Cholesterol: 108 mg/dL — ABNORMAL HIGH (ref 0–99)
NonHDL: 115.79
Total CHOL/HDL Ratio: 2
Triglycerides: 37 mg/dL (ref 0.0–149.0)
VLDL: 7.4 mg/dL (ref 0.0–40.0)

## 2020-11-25 LAB — VITAMIN B12: Vitamin B-12: 393 pg/mL (ref 211–911)

## 2020-11-25 LAB — VITAMIN D 25 HYDROXY (VIT D DEFICIENCY, FRACTURES): VITD: 14.16 ng/mL — ABNORMAL LOW (ref 30.00–100.00)

## 2020-11-25 LAB — TSH: TSH: 0.95 u[IU]/mL (ref 0.35–5.50)

## 2020-11-25 LAB — HEMOGLOBIN A1C: Hgb A1c MFr Bld: 6.2 % (ref 4.6–6.5)

## 2020-11-25 MED ORDER — VITAMIN D (ERGOCALCIFEROL) 1.25 MG (50000 UNIT) PO CAPS: 50000.0000 [IU] | ORAL_CAPSULE | ORAL | 0 refills | Status: AC

## 2020-11-25 NOTE — Progress Notes (Signed)
Established Patient Office Visit     This visit occurred during the SARS-CoV-2 public health emergency.  Safety protocols were in place, including screening questions prior to the visit, additional usage of staff PPE, and extensive cleaning of exam room while observing appropriate contact time as indicated for disinfecting solutions.    CC/Reason for Visit: Annual preventive exam and subsequent Medicare wellness visit  HPI: Shelly Arnold is a 70 y.o. female who is coming in today for the above mentioned reasons. Past Medical History is significant for: Vitamin D deficiency and at times low back pain with sciatica that is not active today.  She has been doing well and has no acute concerns.  She has routine eye care but no dental care.  No perceived hearing issues.  She exercises by walking about 3 days a week and she just joined the [facility].  She is overdue for pneumonia, Tdap and shingles vaccines.  She has had all of her COVID vaccinations.  She is overdue for screening colonoscopy.  She had a mammogram in July that was reported as negative.  She was told by her GYN that she no longer required Pap smears and has not had 1 in at least 5 years.   Past Medical/Surgical History: Past Medical History:  Diagnosis Date   DDD (degenerative disc disease), lumbar     No past surgical history on file.  Social History:  reports that she has never smoked. She has never used smokeless tobacco. She reports previous alcohol use. She reports that she does not use drugs.  Allergies: No Known Allergies  Family History:  Family History  Problem Relation Age of Onset   Diabetes Mother    Diabetes Maternal Grandfather    Diabetes Paternal Uncle      Current Outpatient Medications:    co-enzyme Q-10 30 MG capsule, Take 30 mg by mouth 3 (three) times daily., Disp: , Rfl:    calcium gluconate 500 MG tablet, Take 1 tablet by mouth daily., Disp: , Rfl:    Cyanocobalamin (B-12) 3000 MCG CAPS,  Take by mouth., Disp: , Rfl:   Review of Systems:  Constitutional: Denies fever, chills, diaphoresis, appetite change and fatigue.  HEENT: Denies photophobia, eye pain, redness, hearing loss, ear pain, congestion, sore throat, rhinorrhea, sneezing, mouth sores, trouble swallowing, neck pain, neck stiffness and tinnitus.   Respiratory: Denies SOB, DOE, cough, chest tightness,  and wheezing.   Cardiovascular: Denies chest pain, palpitations and leg swelling.  Gastrointestinal: Denies nausea, vomiting, abdominal pain, diarrhea, constipation, blood in stool and abdominal distention.  Genitourinary: Denies dysuria, urgency, frequency, hematuria, flank pain and difficulty urinating.  Endocrine: Denies: hot or cold intolerance, sweats, changes in hair or nails, polyuria, polydipsia. Musculoskeletal: Denies myalgias, back pain, joint swelling, arthralgias and gait problem.  Skin: Denies pallor, rash and wound.  Neurological: Denies dizziness, seizures, syncope, weakness, light-headedness, numbness and headaches.  Hematological: Denies adenopathy. Easy bruising, personal or family bleeding history  Psychiatric/Behavioral: Denies suicidal ideation, mood changes, confusion, nervousness, sleep disturbance and agitation    Physical Exam: Vitals:   11/25/20 0937  BP: 130/80  Pulse: 83  Temp: 97.9 F (36.6 C)  TempSrc: Oral  SpO2: 97%  Weight: 135 lb (61.2 kg)  Height: 5\' 2"  (1.575 m)    Body mass index is 24.69 kg/m.   Constitutional: NAD, calm, comfortable Eyes: PERRL, lids and conjunctivae normal, wears corrective lenses ENMT: Mucous membranes are moist. Posterior pharynx clear of any exudate or lesions. Normal  dentition. Tympanic membrane is pearly white, no erythema or bulging. Neck: normal, supple, no masses, no thyromegaly Respiratory: clear to auscultation bilaterally, no wheezing, no crackles. Normal respiratory effort. No accessory muscle use.  Cardiovascular: Regular rate and  rhythm, no murmurs / rubs / gallops. No extremity edema. 2+ pedal pulses. No carotid bruits.  Abdomen: no tenderness, no masses palpated. No hepatosplenomegaly. Bowel sounds positive.  Musculoskeletal: no clubbing / cyanosis. No joint deformity upper and lower extremities. Good ROM, no contractures. Normal muscle tone.  Skin: no rashes, lesions, ulcers. No induration Neurologic: CN 2-12 grossly intact. Sensation intact, DTR normal. Strength 5/5 in all 4.  Psychiatric: Normal judgment and insight. Alert and oriented x 3. Normal mood.    Subsequent Medicare wellness visit   1. Risk factors, based on past  M,S,F -cardiovascular disease risk factors include age only   2.  Physical activities: Walks 3 days a week   3.  Depression/mood: Stable, not depressed   4.  Hearing: No perceived hearing issues   5.  ADL's: Independent in all ADLs   6.  Fall risk: Low fall risk   7.  Home safety: No problems identified   8.  Height weight, and visual acuity: height and weight as above, vision:  Vision Screening   Right eye Left eye Both eyes  Without correction     With correction 20/25 20/20 20/20      9.  Counseling: Advised to update her vaccination status   10. Lab orders based on risk factors: Laboratory update will be reviewed   11. Referral : To GI for initial screening colonoscopy   12. Care plan: Follow-up with me in 1 year   13. Cognitive assessment: No cognitive impairment   14. Screening: Patient provided with a written and personalized 5-10 year screening schedule in the AVS. yes   15. Provider List Update: PCP only  16. Advance Directives: Full code   17. Opioids: Patient is not on any opioid prescriptions and has no risk factors for a substance use disorder.   Flowsheet Row Office Visit from 11/25/2020 in [facility]  PHQ-9 Total Score 3       Fall Risk  11/25/2020 09/23/2019  Falls in the past year? 0 1  Number falls in past yr: 0 0  Injury  with Fall? 0 0     Impression and Plan:  Encounter for preventive health examination  -Advised routine eye and dental care. -PCV 20 in office today, she will get Tdap and Shingrix at pharmacy. -Screening labs today. -Healthy lifestyle discussed in detail. -DEXA scan requested for osteoporosis screening. -Normal mammogram in July 2022. -Sent to GI for initial screening colonoscopy. -She prefers to no longer do Pap smears, she is not sexually active.  Screening for colon cancer  - Plan: Ambulatory referral to Gastroenterology  Vitamin D deficiency  - Plan: VITAMIN D 25 Hydroxy (Vit-D Deficiency, Fractures)  Need for vaccination against Streptococcus pneumoniae -PCV 20 administered today.   Patient Instructions  -Nice seeing you today!!  -Lab work today; will notify you once results are available.  -Pneumonia vaccine today.  -Remember to get the following vaccines at your pharmacy: Tdap and Shingrix.  -Schedule follow up in 1 year or sooner as needed.      August 2022, MD [facility] at [facility]

## 2020-11-25 NOTE — Addendum Note (Signed)
Addended by: Kandra Nicolas on: 11/25/2020 10:07 AM   Modules accepted: Orders

## 2020-11-25 NOTE — Addendum Note (Signed)
Addended by: Kern Reap B on: 11/25/2020 11:36 AM   Modules accepted: Orders

## 2020-11-25 NOTE — Patient Instructions (Signed)
-  Nice seeing you today!!  -Lab work today; will notify you once results are available.  -Pneumonia vaccine today.  -Remember to get the following vaccines at your pharmacy: Tdap and Shingrix.  -Schedule follow up in 1 year or sooner as needed.

## 2020-11-29 NOTE — Addendum Note (Signed)
Addended by: Johnella Moloney on: 11/29/2020 04:34 PM   Modules accepted: Orders

## 2020-12-03 ENCOUNTER — Other Ambulatory Visit: Payer: Self-pay

## 2020-12-03 ENCOUNTER — Other Ambulatory Visit: Payer: Self-pay | Admitting: Internal Medicine

## 2020-12-03 ENCOUNTER — Ambulatory Visit (INDEPENDENT_AMBULATORY_CARE_PROVIDER_SITE_OTHER)
Admission: RE | Admit: 2020-12-03 | Discharge: 2020-12-03 | Disposition: A | Discharge status: Home / Self Care | Payer: Medicare Other | Source: Ambulatory Visit | Attending: Internal Medicine | Admitting: Internal Medicine

## 2020-12-03 DIAGNOSIS — Z780 Asymptomatic menopausal state: Secondary | ICD-10-CM

## 2020-12-03 DIAGNOSIS — Z13820 Encounter for screening for osteoporosis: Secondary | ICD-10-CM

## 2021-03-01 ENCOUNTER — Other Ambulatory Visit: Payer: Self-pay

## 2021-03-02 ENCOUNTER — Ambulatory Visit (INDEPENDENT_AMBULATORY_CARE_PROVIDER_SITE_OTHER): Payer: Medicare Other | Admitting: Internal Medicine

## 2021-03-02 ENCOUNTER — Encounter: Payer: Self-pay | Admitting: Internal Medicine

## 2021-03-02 VITALS — BP 140/70 | HR 92 | Temp 97.9°F | Ht 62.0 in | Wt 129.9 lb

## 2021-03-02 DIAGNOSIS — Z1211 Encounter for screening for malignant neoplasm of colon: Secondary | ICD-10-CM

## 2021-03-02 DIAGNOSIS — Z23 Encounter for immunization: Secondary | ICD-10-CM | POA: Diagnosis not present

## 2021-03-02 NOTE — Progress Notes (Addendum)
She simply requested colon cancer screening and a flu vaccine, no charge visit.  Peggye Pitt, MD [facility] at [facility]

## 2021-04-15 ENCOUNTER — Encounter: Payer: Self-pay | Admitting: Internal Medicine

## 2021-05-11 ENCOUNTER — Encounter: Payer: Medicare Other | Admitting: Internal Medicine

## 2021-06-07 ENCOUNTER — Other Ambulatory Visit (INDEPENDENT_AMBULATORY_CARE_PROVIDER_SITE_OTHER): Payer: Medicare Other

## 2021-06-07 DIAGNOSIS — E7800 Pure hypercholesterolemia, unspecified: Secondary | ICD-10-CM

## 2021-06-07 DIAGNOSIS — R7303 Prediabetes: Secondary | ICD-10-CM

## 2021-06-07 LAB — LIPID PANEL
Cholesterol: 182 mg/dL (ref 0–200)
HDL: 84.5 mg/dL (ref >39.00)
LDL Cholesterol: 87 mg/dL (ref 0–99)
NonHDL: 97.09
Total CHOL/HDL Ratio: 2
Triglycerides: 50 mg/dL (ref 0.0–149.0)
VLDL: 10 mg/dL (ref 0.0–40.0)

## 2021-06-07 LAB — HEMOGLOBIN A1C: Hgb A1c MFr Bld: 6.1 % (ref 4.6–6.5)

## 2021-06-24 ENCOUNTER — Encounter: Payer: Medicare Other | Admitting: Internal Medicine

## 2021-09-08 DIAGNOSIS — H43393 Other vitreous opacities, bilateral: Secondary | ICD-10-CM | POA: Diagnosis not present

## 2021-09-26 ENCOUNTER — Ambulatory Visit (INDEPENDENT_AMBULATORY_CARE_PROVIDER_SITE_OTHER): Payer: Medicare Other

## 2021-09-26 VITALS — BP 128/62 | HR 85 | Temp 98.1°F | Ht 62.0 in | Wt 127.8 lb

## 2021-09-26 DIAGNOSIS — Z1211 Encounter for screening for malignant neoplasm of colon: Secondary | ICD-10-CM | POA: Diagnosis not present

## 2021-09-26 DIAGNOSIS — Z0000 Encounter for general adult medical examination without abnormal findings: Secondary | ICD-10-CM | POA: Diagnosis not present

## 2021-09-26 NOTE — Progress Notes (Signed)
Subjective:   Shelly Arnold is a 71 y.o. female who presents for Medicare Annual (Subsequent) preventive examination.  Review of Systems         Objective:    Today's Vitals   09/26/21 0940  BP: 128/62  Pulse: 85  Temp: 98.1 F (36.7 C)  TempSrc: Oral  SpO2: 98%  Weight: 127 lb 12.8 oz (58 kg)  Height: 5\' 2"  (1.575 m)   Body mass index is 23.37 kg/m.     09/26/2021    9:58 AM  Advanced Directives  Does Patient Have a Medical Advance Directive? No  Would patient like information on creating a medical advance directive? No - Patient declined    Current Medications (verified) Outpatient Encounter Medications as of 09/26/2021  Medication Sig   calcium gluconate 500 MG tablet Take 1 tablet by mouth daily.   co-enzyme Q-10 30 MG capsule Take 30 mg by mouth 3 (three) times daily.   No facility-administered encounter medications on file as of 09/26/2021.    Allergies (verified) Patient has no known allergies.   History: Past Medical History:  Diagnosis Date   DDD (degenerative disc disease), lumbar    History reviewed. No pertinent surgical history. Family History  Problem Relation Age of Onset   Diabetes Mother    Diabetes Maternal Grandfather    Diabetes Paternal Uncle    Social History   Socioeconomic History   Marital status: Single    Spouse name: Not on file   Number of children: Not on file   Years of education: Not on file   Highest education level: Not on file  Occupational History   Not on file  Tobacco Use   Smoking status: Never   Smokeless tobacco: Never  Substance and Sexual Activity   Alcohol use: Not Currently   Drug use: Never   Sexual activity: Not on file  Other Topics Concern   Not on file  Social History Narrative   Not on file   Social Determinants of Health   Financial Resource Strain: Low Risk    Difficulty of Paying Living Expenses: Not hard at all  Food Insecurity: No Food Insecurity   Worried About Radiation protection practitioner of  Food in the Last Year: Never true   Ran Out of Food in the Last Year: Never true  Transportation Needs: No Transportation Needs   Lack of Transportation (Medical): No   Lack of Transportation (Non-Medical): No  Physical Activity: Insufficiently Active   Days of Exercise per Week: 3 days   Minutes of Exercise per Session: 20 min  Stress: No Stress Concern Present   Feeling of Stress : Not at all  Social Connections: Socially Isolated   Frequency of Communication with Friends and Family: More than three times a week   Frequency of Social Gatherings with Friends and Family: More than three times a week   Attends Religious Services: Never   Database administrator or Organizations: No   Attends Banker Meetings: Never   Marital Status: Never married     Clinical Intake:  Pre-visit preparation completed: NoDiabetic?  No  Activities of Daily Living    09/26/2021    9:56 AM  In your present state of health, do you have any difficulty performing the following activities:  Hearing? 0  Vision? 0  Difficulty concentrating or making decisions? 0  Walking or climbing stairs? 0  Dressing or bathing? 0  Doing errands, shopping? 0  Preparing Food and eating ?  N  Using the Toilet? N  Do you have problems with loss of bowel control? N  Managing your Medications? N  Managing your Finances? N  Housekeeping or managing your Housekeeping? N    Patient Care Team: Isaac Bliss, Rayford Halsted, MD as PCP - General (Internal Medicine)  Indicate any recent Medical Services you may have received from other than [facility] providers in the past year (date may be approximate).     Assessment:   This is a routine wellness examination for Shelly Arnold.  Hearing/Vision screen Hearing Screening - Comments:: No hearing difficulty Vision Screening - Comments:: Wears glasses. Followed by [facility]  Dietary issues and exercise activities discussed: Exercise limited by: None identified   Goals  Addressed               This Visit's Progress     Increase physical activity (pt-stated)        I want to lose a few pounds.        Depression Screen    09/26/2021    9:52 AM 11/25/2020    9:36 AM 10/02/2019   11:17 AM 09/23/2019   11:36 AM  PHQ 2/9 Scores  PHQ - 2 Score 0 1 0 1  PHQ- 9 Score  3  3    Fall Risk    09/26/2021    9:57 AM 09/26/2021    7:51 AM 11/25/2020    9:35 AM 09/23/2019   11:36 AM  Fall Risk   Falls in the past year? 0 0 0 1  Number falls in past yr: 0  0 0  Injury with Fall? 0  0 0  Risk for fall due to : No Fall Risks       FALL RISK PREVENTION PERTAINING TO THE HOME:  Any stairs in or around the home? Yes  If so, are there any without handrails? No  Home free of loose throw rugs in walkways, pet beds, electrical cords, etc? Yes  Adequate lighting in your home to reduce risk of falls? Yes   ASSISTIVE DEVICES UTILIZED TO PREVENT FALLS:  Life alert?  Use of a cane, walker or w/c? No  Grab bars in the bathroom? No  Shower chair or bench in shower? No  Elevated toilet seat or a handicapped toilet? No   TIMED UP AND GO:  Was the test performed? Yes .  Length of time to ambulate 10 feet: 5 sec.   Gait steady and fast without use of assistive device  Cognitive Function:     Immunizations Immunization History  Administered Date(s) Administered   Fluad Quad(high Dose 65+) 03/02/2021   PFIZER(Purple Top)SARS-COV-2 Vaccination 07/04/2019, 07/30/2019, 02/06/2020, 08/16/2020   PNEUMOCOCCAL CONJUGATE-20 11/25/2020   Pneumococcal-Unspecified 10/02/2018    TDAP status: Due, Education has been provided regarding the importance of this vaccine. Advised may receive this vaccine at local pharmacy or Health Dept. Aware to provide a copy of the vaccination record if obtained from local pharmacy or Health Dept. Verbalized acceptance and understanding.  Flu Vaccine status: Up to date  Pneumococcal vaccine status: Up to date  Covid-19 vaccine  status: Completed vaccines  Qualifies for Shingles Vaccine? Yes   Zostavax completed No   Shingrix Completed?: No.    Education has been provided regarding the importance of this vaccine. Patient has been advised to call insurance company to determine out of pocket expense if they have not yet received this vaccine. Advised may also receive vaccine at local pharmacy or Health Dept. Verbalized  acceptance and understanding.  Screening Tests Health Maintenance  Topic Date Due   COVID-19 Vaccine (5 - Booster for Pfizer series) 10/12/2021 (Originally 10/11/2020)   COLONOSCOPY (Pts 45-34yrs Insurance coverage will need to be confirmed)  11/25/2021 (Originally 07/17/1994)   Zoster Vaccines- Shingrix (1 of 2) 12/27/2021 (Originally 07/17/1999)   TETANUS/TDAP  09/27/2022 (Originally [birth year]16/1968)   Hepatitis C Screening  09/27/2022 (Originally 07/17/1968)   INFLUENZA VACCINE  12/06/2021   MAMMOGRAM  11/18/2022   Pneumonia Vaccine 64+ Years old  Completed   DEXA SCAN  Completed   HPV VACCINES  Aged Out    Health Maintenance  There are no preventive care reminders to display for this patient.   Colorectal cancer screening: Referral to GI placed 09/26/21. Pt aware the office will call re: appt.  Mammogram status: Completed 11/17/20. Repeat every year  Bone Density status: Completed 12/03/20. Results reflect: Bone density results: OSTEOPOROSIS. Repeat every 2 years.  Lung Cancer Screening: (Low Dose CT Chest recommended if Age 28-80 years, 30 pack-year currently smoking OR have quit w/in 15years.) does not qualify.   Additional Screening:  Hepatitis C Screening: does qualify; Completed Patient deferred  Vision Screening: Recommended annual ophthalmology exams for early detection of glaucoma and other disorders of the eye. Is the patient up to date with their annual eye exam?  Yes  Who is the provider or what is the name of the office in which the patient attends annual eye exams? [facility] If pt is  not established with a provider, would they like to be referred to a provider to establish care? No .   Dental Screening: Recommended annual dental exams for proper oral hygiene  Community Resource Referral / Chronic Care Management:  CRR required this visit?  No   CCM required this visit?  No      Plan:     I have personally reviewed and noted the following in the patient's chart:   Medical and social history Use of alcohol, tobacco or illicit drugs  Current medications and supplements including opioid prescriptions.  Functional ability and status Nutritional status Physical activity Advanced directives List of other physicians Hospitalizations, surgeries, and ER visits in previous 12 months Vitals Screenings to include cognitive, depression, and falls Referrals and appointments  In addition, I have reviewed and discussed with patient certain preventive protocols, quality metrics, and best practice recommendations. A written personalized care plan for preventive services as well as general preventive health recommendations were provided to patient.     Criselda Peaches, LPN   [id number]   Nurse Notes: None

## 2021-09-26 NOTE — Patient Instructions (Addendum)
Shelly Arnold , Thank you for taking time to come for your Medicare Wellness Visit. I appreciate your ongoing commitment to your health goals. Please review the following plan we discussed and let me know if I can assist you in the future.   These are the goals we discussed:  Goals       Increase physical activity (pt-stated)      I want to lose a few pounds.         This is a list of the screening recommended for you and due dates:  Health Maintenance  Topic Date Due   COVID-19 Vaccine (5 - Booster for Pfizer series) 10/12/2021*   Colon Cancer Screening  11/25/2021*   Zoster (Shingles) Vaccine (1 of 2) 12/27/2021*   Tetanus Vaccine  09/27/2022*   Hepatitis C Screening: USPSTF Recommendation to screen - Ages 18-79 yo.  09/27/2022*   Flu Shot  12/06/2021   Mammogram  11/18/2022   Pneumonia Vaccine  Completed   DEXA scan (bone density measurement)  Completed   HPV Vaccine  Aged Out  *Topic was postponed. The date shown is not the original due date.    Advanced directives: No   Conditions/risks identified: None  Next appointment: Follow up in one year for your annual wellness visit    Preventive Care 65 Years and Older, Female Preventive care refers to lifestyle choices and visits with your health care provider that can promote health and wellness. What does preventive care include? A yearly physical exam. This is also called an annual well check. Dental exams once or twice a year. Routine eye exams. Ask your health care provider how often you should have your eyes checked. Personal lifestyle choices, including: Daily care of your teeth and gums. Regular physical activity. Eating a healthy diet. Avoiding tobacco and drug use. Limiting alcohol use. Practicing safe sex. Taking low-dose aspirin every day. Taking vitamin and mineral supplements as recommended by your health care provider. What happens during an annual well check? The services and screenings done by your  health care provider during your annual well check will depend on your age, overall health, lifestyle risk factors, and family history of disease. Counseling  Your health care provider may ask you questions about your: Alcohol use. Tobacco use. Drug use. Emotional well-being. Home and relationship well-being. Sexual activity. Eating habits. History of falls. Memory and ability to understand (cognition). Work and work Statistician. Reproductive health. Screening  You may have the following tests or measurements: Height, weight, and BMI. Blood pressure. Lipid and cholesterol levels. These may be checked every 5 years, or more frequently if you are over 61 years old. Skin check. Lung cancer screening. You may have this screening every year starting at age 79 if you have a 30-pack-year history of smoking and currently smoke or have quit within the past 15 years. Fecal occult blood test (FOBT) of the stool. You may have this test every year starting at age 64. Flexible sigmoidoscopy or colonoscopy. You may have a sigmoidoscopy every 5 years or a colonoscopy every 10 years starting at age 15. Hepatitis C blood test. Hepatitis B blood test. Sexually transmitted disease (STD) testing. Diabetes screening. This is done by checking your blood sugar (glucose) after you have not eaten for a while (fasting). You may have this done every 1-3 years. Bone density scan. This is done to screen for osteoporosis. You may have this done starting at age 78. Mammogram. This may be done every 1-2 years. Talk to  your health care provider about how often you should have regular mammograms. Talk with your health care provider about your test results, treatment options, and if necessary, the need for more tests. Vaccines  Your health care provider may recommend certain vaccines, such as: Influenza vaccine. This is recommended every year. Tetanus, diphtheria, and acellular pertussis (Tdap, Td) vaccine. You may  need a Td booster every 10 years. Zoster vaccine. You may need this after age 76. Pneumococcal 13-valent conjugate (PCV13) vaccine. One dose is recommended after age 37. Pneumococcal polysaccharide (PPSV23) vaccine. One dose is recommended after age 66. Talk to your health care provider about which screenings and vaccines you need and how often you need them. This information is not intended to replace advice given to you by your health care provider. Make sure you discuss any questions you have with your health care provider. Document Released: 05/21/2015 Document Revised: 01/12/2016 Document Reviewed: 02/23/2015 Elsevier Interactive Patient Education  2017 [place] Prevention in the Home Falls can cause injuries. They can happen to people of all ages. There are many things you can do to make your home safe and to help prevent falls. What can I do on the outside of my home? Regularly fix the edges of walkways and driveways and fix any cracks. Remove anything that might make you trip as you walk through a door, such as a raised step or threshold. Trim any bushes or trees on the path to your home. Use bright outdoor lighting. Clear any walking paths of anything that might make someone trip, such as rocks or tools. Regularly check to see if handrails are loose or broken. Make sure that both sides of any steps have handrails. Any raised decks and porches should have guardrails on the edges. Have any leaves, snow, or ice cleared regularly. Use sand or salt on walking paths during winter. Clean up any spills in your garage right away. This includes oil or grease spills. What can I do in the bathroom? Use night lights. Install grab bars by the toilet and in the tub and shower. Do not use towel bars as grab bars. Use non-skid mats or decals in the tub or shower. If you need to sit down in the shower, use a plastic, non-slip stool. Keep the floor dry. Clean up any water that spills on  the floor as soon as it happens. Remove soap buildup in the tub or shower regularly. Attach bath mats securely with double-sided non-slip rug tape. Do not have throw rugs and other things on the floor that can make you trip. What can I do in the bedroom? Use night lights. Make sure that you have a light by your bed that is easy to reach. Do not use any sheets or blankets that are too big for your bed. They should not hang down onto the floor. Have a firm chair that has side arms. You can use this for support while you get dressed. Do not have throw rugs and other things on the floor that can make you trip. What can I do in the kitchen? Clean up any spills right away. Avoid walking on wet floors. Keep items that you use a lot in easy-to-reach places. If you need to reach something above you, use a strong step stool that has a grab bar. Keep electrical cords out of the way. Do not use floor polish or wax that makes floors slippery. If you must use wax, use non-skid floor wax. Do not  have throw rugs and other things on the floor that can make you trip. What can I do with my stairs? Do not leave any items on the stairs. Make sure that there are handrails on both sides of the stairs and use them. Fix handrails that are broken or loose. Make sure that handrails are as long as the stairways. Check any carpeting to make sure that it is firmly attached to the stairs. Fix any carpet that is loose or worn. Avoid having throw rugs at the top or bottom of the stairs. If you do have throw rugs, attach them to the floor with carpet tape. Make sure that you have a light switch at the top of the stairs and the bottom of the stairs. If you do not have them, ask someone to add them for you. What else can I do to help prevent falls? Wear shoes that: Do not have high heels. Have rubber bottoms. Are comfortable and fit you well. Are closed at the toe. Do not wear sandals. If you use a stepladder: Make sure  that it is fully opened. Do not climb a closed stepladder. Make sure that both sides of the stepladder are locked into place. Ask someone to hold it for you, if possible. Clearly mark and make sure that you can see: Any grab bars or handrails. First and last steps. Where the edge of each step is. Use tools that help you move around (mobility aids) if they are needed. These include: Canes. Walkers. Scooters. Crutches. Turn on the lights when you go into a dark area. Replace any light bulbs as soon as they burn out. Set up your furniture so you have a clear path. Avoid moving your furniture around. If any of your floors are uneven, fix them. If there are any pets around you, be aware of where they are. Review your medicines with your doctor. Some medicines can make you feel dizzy. This can increase your chance of falling. Ask your doctor what other things that you can do to help prevent falls. This information is not intended to replace advice given to you by your health care provider. Make sure you discuss any questions you have with your health care provider. Document Released: 02/18/2009 Document Revised: 09/30/2015 Document Reviewed: 05/29/2014 Elsevier Interactive Patient Education  2017 [employer].

## 2021-09-26 NOTE — Addendum Note (Signed)
Addended by: Tillie Rung on: 09/26/2021 11:59 AM   Modules accepted: Orders

## 2021-10-13 ENCOUNTER — Other Ambulatory Visit: Payer: Self-pay | Admitting: Internal Medicine

## 2021-10-13 DIAGNOSIS — Z1231 Encounter for screening mammogram for malignant neoplasm of breast: Secondary | ICD-10-CM

## 2021-11-18 ENCOUNTER — Ambulatory Visit
Admission: RE | Admit: 2021-11-18 | Discharge: 2021-11-18 | Disposition: A | Discharge status: Home / Self Care | Payer: Medicare Other | Source: Ambulatory Visit | Attending: Internal Medicine | Admitting: Internal Medicine

## 2021-11-18 DIAGNOSIS — Z1231 Encounter for screening mammogram for malignant neoplasm of breast: Secondary | ICD-10-CM | POA: Diagnosis not present

## 2021-11-29 ENCOUNTER — Encounter: Payer: Self-pay | Admitting: Internal Medicine

## 2021-11-29 ENCOUNTER — Ambulatory Visit (INDEPENDENT_AMBULATORY_CARE_PROVIDER_SITE_OTHER): Payer: Medicare Other | Admitting: Internal Medicine

## 2021-11-29 ENCOUNTER — Other Ambulatory Visit: Payer: Self-pay | Admitting: Internal Medicine

## 2021-11-29 VITALS — BP 120/80 | HR 82 | Temp 97.7°F | Ht 62.0 in | Wt 126.6 lb

## 2021-11-29 DIAGNOSIS — Z0000 Encounter for general adult medical examination without abnormal findings: Secondary | ICD-10-CM

## 2021-11-29 DIAGNOSIS — R7302 Impaired glucose tolerance (oral): Secondary | ICD-10-CM

## 2021-11-29 DIAGNOSIS — Z1211 Encounter for screening for malignant neoplasm of colon: Secondary | ICD-10-CM | POA: Diagnosis not present

## 2021-11-29 DIAGNOSIS — M25512 Pain in left shoulder: Secondary | ICD-10-CM

## 2021-11-29 DIAGNOSIS — E559 Vitamin D deficiency, unspecified: Secondary | ICD-10-CM

## 2021-11-29 DIAGNOSIS — G8929 Other chronic pain: Secondary | ICD-10-CM

## 2021-11-29 LAB — CBC WITH DIFFERENTIAL/PLATELET
Basophils Absolute: 0 10*3/uL (ref 0.0–0.1)
Basophils Relative: 0.4 % (ref 0.0–3.0)
Eosinophils Absolute: 0 10*3/uL (ref 0.0–0.7)
Eosinophils Relative: 0 % (ref 0.0–5.0)
HCT: 38.9 % (ref 36.0–46.0)
Hemoglobin: 12.6 g/dL (ref 12.0–15.0)
Lymphocytes Relative: 22.8 % (ref 12.0–46.0)
Lymphs Abs: 1.4 10*3/uL (ref 0.7–4.0)
MCHC: 32.4 g/dL (ref 30.0–36.0)
MCV: 80.3 fl (ref 78.0–100.0)
Monocytes Absolute: 0.6 10*3/uL (ref 0.1–1.0)
Monocytes Relative: 10 % (ref 3.0–12.0)
Neutro Abs: 4.2 10*3/uL (ref 1.4–7.7)
Neutrophils Relative %: 66.8 % (ref 43.0–77.0)
Platelets: 204 10*3/uL (ref 150.0–400.0)
RBC: 4.85 Mil/uL (ref 3.87–5.11)
RDW: 13.7 % (ref 11.5–15.5)
WBC: 6.2 10*3/uL (ref 4.0–10.5)

## 2021-11-29 LAB — COMPREHENSIVE METABOLIC PANEL
ALT: 10 U/L (ref 0–35)
AST: 19 U/L (ref 0–37)
Albumin: 4.7 g/dL (ref 3.5–5.2)
Alkaline Phosphatase: 61 U/L (ref 39–117)
BUN: 12 mg/dL (ref 6–23)
CO2: 28 mEq/L (ref 19–32)
Calcium: 9.7 mg/dL (ref 8.4–10.5)
Chloride: 103 mEq/L (ref 96–112)
Creatinine, Ser: 0.75 mg/dL (ref 0.40–1.20)
GFR: 79.57 mL/min (ref >60.00)
Glucose, Bld: 95 mg/dL (ref 70–99)
Potassium: 4.6 mEq/L (ref 3.5–5.1)
Sodium: 139 mEq/L (ref 135–145)
Total Bilirubin: 0.5 mg/dL (ref 0.2–1.2)
Total Protein: 7.9 g/dL (ref 6.0–8.3)

## 2021-11-29 LAB — LIPID PANEL
Cholesterol: 200 mg/dL (ref 0–200)
HDL: 101.1 mg/dL (ref >39.00)
LDL Cholesterol: 91 mg/dL (ref 0–99)
NonHDL: 99.27
Total CHOL/HDL Ratio: 2
Triglycerides: 41 mg/dL (ref 0.0–149.0)
VLDL: 8.2 mg/dL (ref 0.0–40.0)

## 2021-11-29 LAB — VITAMIN D 25 HYDROXY (VIT D DEFICIENCY, FRACTURES): VITD: 17.22 ng/mL — ABNORMAL LOW (ref 30.00–100.00)

## 2021-11-29 LAB — TSH: TSH: 0.95 u[IU]/mL (ref 0.35–5.50)

## 2021-11-29 LAB — VITAMIN B12: Vitamin B-12: 430 pg/mL (ref 211–911)

## 2021-11-29 LAB — HEMOGLOBIN A1C: Hgb A1c MFr Bld: 6.2 % (ref 4.6–6.5)

## 2021-11-29 MED ORDER — VITAMIN D (ERGOCALCIFEROL) 1.25 MG (50000 UNIT) PO CAPS: 50000.0000 [IU] | ORAL_CAPSULE | ORAL | 0 refills | Status: AC

## 2021-11-29 NOTE — Patient Instructions (Signed)
-  Nice seeing you today!!  -Lab work today; will notify you once results are available.  -Remember your COVID, tdap and shingles vaccines at your pharmacy.  -Consider updating your PAP smear.  -Referral for colonoscopy has been placed.  -See you back in 6 months.

## 2021-11-29 NOTE — Progress Notes (Signed)
Established Patient Office Visit     CC/Reason for Visit: Annual preventive exam and subsequent Medicare wellness visit   HPI: Shelly Arnold is a 71 y.o. female who is coming in today for the above mentioned reasons. Past Medical History is significant for: Impaired glucose tolerance and vitamin D deficiency.  She has routine eye care but has not seen a dentist in some time, no perceived hearing issues, she exercises by walking for about 30 minutes 3 to 4 days a week.  She is overdue for Tdap, shingles, bivalent COVID-vaccine.  She is also overdue for colon and cervical cancer screening.  She has been complaining of chronic left shoulder pain with limitation to range of motion.   Past Medical/Surgical History: Past Medical History:  Diagnosis Date   DDD (degenerative disc disease), lumbar     No past surgical history on file.  Social History:  reports that she has never smoked. She has never used smokeless tobacco. She reports that she does not currently use alcohol. She reports that she does not use drugs.  Allergies: No Known Allergies  Family History:  Family History  Problem Relation Age of Onset   Diabetes Mother    Diabetes Paternal Uncle    Diabetes Maternal Grandfather    Breast cancer Neg Hx      Current Outpatient Medications:    calcium gluconate 500 MG tablet, Take 1 tablet by mouth daily., Disp: , Rfl:    co-enzyme Q-10 30 MG capsule, Take 30 mg by mouth 3 (three) times daily., Disp: , Rfl:   Review of Systems:  Constitutional: Denies fever, chills, diaphoresis, appetite change and fatigue.  HEENT: Denies photophobia, eye pain, redness, hearing loss, ear pain, congestion, sore throat, rhinorrhea, sneezing, mouth sores, trouble swallowing, neck pain, neck stiffness and tinnitus.   Respiratory: Denies SOB, DOE, cough, chest tightness,  and wheezing.   Cardiovascular: Denies chest pain, palpitations and leg swelling.  Gastrointestinal: Denies nausea,  vomiting, abdominal pain, diarrhea, constipation, blood in stool and abdominal distention.  Genitourinary: Denies dysuria, urgency, frequency, hematuria, flank pain and difficulty urinating.  Endocrine: Denies: hot or cold intolerance, sweats, changes in hair or nails, polyuria, polydipsia. Musculoskeletal: Denies myalgias, back pain, joint swelling and gait problem.  Skin: Denies pallor, rash and wound.  Neurological: Denies dizziness, seizures, syncope, weakness, light-headedness, numbness and headaches.  Hematological: Denies adenopathy. Easy bruising, personal or family bleeding history  Psychiatric/Behavioral: Denies suicidal ideation, mood changes, confusion, nervousness, sleep disturbance and agitation    Physical Exam: Vitals:   11/29/21 1037  BP: 120/80  Pulse: 82  Temp: 97.7 F (36.5 C)  TempSrc: Oral  SpO2: 99%  Weight: 126 lb 9.6 oz (57.4 kg)  Height: 5\' 2"  (1.575 m)    Body mass index is 23.16 kg/m.   Constitutional: NAD, calm, comfortable Eyes: PERRL, lids and conjunctivae normal ENMT: Mucous membranes are moist. Posterior pharynx clear of any exudate or lesions. Normal dentition. Tympanic membrane is pearly white, no erythema or bulging. Neck: normal, supple, no masses, no thyromegaly Respiratory: clear to auscultation bilaterally, no wheezing, no crackles. Normal respiratory effort. No accessory muscle use.  Cardiovascular: Regular rate and rhythm, no murmurs / rubs / gallops. No extremity edema. 2+ pedal pulses. No carotid bruits.  Abdomen: no tenderness, no masses palpated. No hepatosplenomegaly. Bowel sounds positive.  Musculoskeletal: no clubbing / cyanosis. No joint deformity upper and lower extremities. Good ROM, no contractures. Normal muscle tone.  Skin: no rashes, lesions, ulcers. No induration  Neurologic: CN 2-12 grossly intact. Sensation intact, DTR normal. Strength 5/5 in all 4.  Psychiatric: Normal judgment and insight. Alert and oriented x 3. Normal  mood.    Subsequent Medicare wellness visit   1. Risk factors, based on past  M,S,F -cardiovascular disease risk factors include age only   2.  Physical activities: Walks 3 days a week for around 30 to 40 minutes   3.  Depression/mood: Stable, not depressed   4.  Hearing: No perceived issues   5.  ADL's: Independent in all ADLs   6.  Fall risk: Low fall risk   7.  Home safety: No problems identified   8.  Height weight, and visual acuity: height and weight as above, vision:  Vision Screening   Right eye Left eye Both eyes  Without correction     With correction 20/25 20/32 20/20      9.  Counseling: Advised to increase physical activity as well as vaccination status and age-appropriate cancer screenings   10. Lab orders based on risk factors: Laboratory update will be reviewed   11. Referral : GI for colonoscopy, orthopedics for evaluation of left shoulder pain   12. Care plan: Follow-up with me in 6 months   13. Cognitive assessment: No cognitive impairment   14. Screening: Patient provided with a written and personalized 5-10 year screening schedule in the AVS. yes   15. Provider List Update: PCP only  16. Advance Directives: Full code   17. Opioids: Patient is not on any opioid prescriptions and has no risk factors for a substance use disorder.   Flowsheet Row Office Visit from 11/29/2021 in [facility] [place]  PHQ-9 Total Score 4          09/23/2019   11:36 AM 11/25/2020    9:35 AM 09/26/2021    7:51 AM 09/26/2021    9:57 AM 11/29/2021   10:42 AM  Fall Risk  Falls in the past year? 1 0 0 0 0  Was there an injury with Fall? 0 0  0 0  Fall Risk Category Calculator 1 0  0 0  Fall Risk Category Low Low  Low Low  Patient Fall Risk Level    Low fall risk Low fall risk  Patient at Risk for Falls Due to    No Fall Risks No Fall Risks  Fall risk Follow up     Falls evaluation completed     Impression and Plan:  Encounter for preventive health  examination  - Plan: CBC with Differential/Platelet, Comprehensive metabolic panel, Lipid panel, TSH, Vitamin B12 -Recommend routine eye and dental care. -Immunizations: Overdue for Tdap, shingles, bivalent COVID, advised to get at pharmacy -Healthy lifestyle discussed in detail. -Labs to be updated today. -Colon cancer screening: Overdue, GI referral placed -Breast cancer screening: 11/2021 -Cervical cancer screening: Overdue, declines today but will reconsider for next visit -Lung cancer screening: Not applicable, never smoker -Prostate cancer screening: Not applicable -DEXA: 11/2020  Screening for malignant neoplasm of colon  - Plan: Ambulatory referral to Gastroenterology  IGT (impaired glucose tolerance)  - Plan: Hemoglobin A1c  Vitamin D deficiency  - Plan: VITAMIN D 25 Hydroxy (Vit-D Deficiency, Fractures)  Chronic left shoulder pain  - Plan: Ambulatory referral to Orthopedic Surgery    Patient Instructions  -Nice seeing you today!!  -Lab work today; will notify you once results are available.  -Remember your COVID, tdap and shingles vaccines at your pharmacy.  -Consider updating your PAP smear.  -Referral  for colonoscopy has been placed.  -See you back in 6 months.      Chaya Jan, MD [facility]

## 2021-12-01 ENCOUNTER — Ambulatory Visit: Payer: Medicare Other | Admitting: Orthopaedic Surgery

## 2021-12-01 ENCOUNTER — Other Ambulatory Visit: Payer: Self-pay | Admitting: *Deleted

## 2021-12-01 ENCOUNTER — Ambulatory Visit (INDEPENDENT_AMBULATORY_CARE_PROVIDER_SITE_OTHER): Payer: Medicare Other

## 2021-12-01 DIAGNOSIS — M25512 Pain in left shoulder: Secondary | ICD-10-CM

## 2021-12-01 DIAGNOSIS — M19012 Primary osteoarthritis, left shoulder: Secondary | ICD-10-CM | POA: Diagnosis not present

## 2021-12-01 DIAGNOSIS — E559 Vitamin D deficiency, unspecified: Secondary | ICD-10-CM

## 2021-12-01 MED ORDER — LIDOCAINE HCL 1 % IJ SOLN
3.0000 mL | INTRAMUSCULAR | Status: AC | PRN
  Administered 2021-12-01: 3 mL

## 2021-12-01 MED ORDER — BUPIVACAINE HCL 0.5 % IJ SOLN
3.0000 mL | INTRAMUSCULAR | Status: AC | PRN
  Administered 2021-12-01: 3 mL via INTRA_ARTICULAR

## 2021-12-01 MED ORDER — METHYLPREDNISOLONE ACETATE 40 MG/ML IJ SUSP
40.0000 mg | INTRAMUSCULAR | Status: AC | PRN
  Administered 2021-12-01: 40 mg via INTRA_ARTICULAR

## 2021-12-01 NOTE — Progress Notes (Signed)
Office Visit Note   Patient: Shelly Arnold           Date of Birth: April 16, 1951           MRN: [id number] Visit Date: 12/01/2021              Requested by: Philip Aspen, Limmie Patricia, MD [address] [place],  [place] [address] PCP: Philip Aspen, Limmie Patricia, MD   Assessment & Plan: Visit Diagnoses:  1. Primary osteoarthritis of left shoulder     Plan: Impression is left shoulder glenohumeral osteoarthritis.  Treatment options were reviewed and she elected for a glenohumeral injection that we performed today.  She had good relief during the anesthetic phase.  Shoulder exercises were provided as well.  Follow-up as needed.  Follow-Up Instructions: No follow-ups on file.   Orders:  Orders Placed This Encounter  Procedures   XR Shoulder Left   No orders of the defined types were placed in this encounter.     Procedures: Large Joint Inj: L glenohumeral on 12/01/2021 10:03 AM Indications: pain Details: 22 G needle  Arthrogram: No  Medications: 3 mL lidocaine 1 %; 3 mL bupivacaine 0.5 %; 40 mg methylPREDNISolone acetate 40 MG/ML Outcome: tolerated well, no immediate complications Patient was prepped and draped in the usual sterile fashion.       Clinical Data: No additional findings.   Subjective: Chief Complaint  Patient presents with   Left Shoulder - Pain    HPI [name] is a 71 year old female here for left shoulder pain for a few months.  Denies any injuries.  Feels stiffness.  Denies any radicular symptoms.  Has trouble lifting her arm above her head.  Right-hand-dominant. Review of Systems  Constitutional: Negative.   HENT: Negative.    Eyes: Negative.   Respiratory: Negative.    Cardiovascular: Negative.   Endocrine: Negative.   Musculoskeletal: Negative.   Neurological: Negative.   Hematological: Negative.   Psychiatric/Behavioral: Negative.    All other systems reviewed and are negative.    Objective: Vital Signs: There were no  vitals taken for this visit.  Physical Exam Vitals and nursing note reviewed.  Constitutional:      Appearance: She is well-developed.  HENT:     Head: Atraumatic.     Nose: Nose normal.  Eyes:     Extraocular Movements: Extraocular movements intact.  Cardiovascular:     Pulses: Normal pulses.  Pulmonary:     Effort: Pulmonary effort is normal.  Abdominal:     Palpations: Abdomen is soft.  Musculoskeletal:     Cervical back: Neck supple.  Skin:    General: Skin is warm.     Capillary Refill: Capillary refill takes less than 2 seconds.  Neurological:     Mental Status: She is alert. Mental status is at baseline.  Psychiatric:        Behavior: Behavior normal.        Thought Content: Thought content normal.        Judgment: Judgment normal.     Ortho Exam Examination of the left shoulder girdle shows flexion to 100 degrees external rotation to 35 degrees internal rotation to L5 and abduction to 70 degrees all with pain.  Manual muscle testing the rotator cuff shows good strength. Specialty Comments:  No specialty comments available.  Imaging: XR Shoulder Left  Result Date: 12/01/2021 Moderate osteoarthritis of glenohumeral joint.  Large inferior humeral head osteophyte.  Degenerative AC joint.  PMFS History: Patient Active Problem List   Diagnosis Date Noted   Primary osteoarthritis of left shoulder 12/01/2021   IGT (impaired glucose tolerance) 11/25/2020   Vitamin D deficiency 10/03/2019   DDD (degenerative disc disease), lumbar    Past Medical History:  Diagnosis Date   DDD (degenerative disc disease), lumbar     Family History  Problem Relation Age of Onset   Diabetes Mother    Diabetes Paternal Uncle    Diabetes Maternal Grandfather    Breast cancer Neg Hx     No past surgical history on file. Social History   Occupational History   Not on file  Tobacco Use   Smoking status: Never   Smokeless tobacco: Never  Substance and Sexual Activity    Alcohol use: Not Currently   Drug use: Never   Sexual activity: Not on file

## 2022-03-16 ENCOUNTER — Telehealth: Payer: Self-pay | Admitting: Internal Medicine

## 2022-03-16 NOTE — Telephone Encounter (Signed)
Patient was inquiring about a flu shot.  Patient will get it at her pharmacy.

## 2022-03-16 NOTE — Telephone Encounter (Signed)
Pt call and stated she need you to give her a call back she stated she have a question .

## 2022-09-18 ENCOUNTER — Telehealth: Payer: Self-pay | Admitting: Internal Medicine

## 2022-09-18 NOTE — Telephone Encounter (Signed)
Contacted Jemila A Primiano to schedule their annual wellness visit. Appointment made for 09/29/22. Rudell Cobb [facility] direct phone # [phone]   Due to schedule change 09/28/22 [facility] r/s to 09/29/22  pt aware

## 2022-09-29 ENCOUNTER — Ambulatory Visit (INDEPENDENT_AMBULATORY_CARE_PROVIDER_SITE_OTHER): Payer: Medicare Other

## 2022-09-29 VITALS — BP 122/62 | HR 76 | Temp 98.1°F | Ht 62.0 in | Wt 122.7 lb

## 2022-09-29 DIAGNOSIS — Z0000 Encounter for general adult medical examination without abnormal findings: Secondary | ICD-10-CM

## 2022-09-29 DIAGNOSIS — Z1211 Encounter for screening for malignant neoplasm of colon: Secondary | ICD-10-CM

## 2022-09-29 NOTE — Patient Instructions (Addendum)
[name] , Thank you for taking time to come for your Medicare Wellness Visit. I appreciate your ongoing commitment to your health goals. Please review the following plan we discussed and let me know if I can assist you in the future.   These are the goals we discussed:  Goals       Increase physical activity (pt-stated)      I want to lose a few pounds.       No current goals (pt-stated)        This is a list of the screening recommended for you and due dates:  Health Maintenance  Topic Date Due   DTaP/Tdap/Td vaccine (1 - Tdap) Never done   COVID-19 Vaccine (5 - 2023-24 season) 10/15/2022*   Zoster (Shingles) Vaccine (1 of 2) 12/30/2022*   Colon Cancer Screening  09/29/2023*   Hepatitis C Screening  09/29/2023*   Flu Shot  12/07/2022   Medicare Annual Wellness Visit  09/29/2023   Mammogram  11/19/2023   Pneumonia Vaccine  Completed   DEXA scan (bone density measurement)  Completed   HPV Vaccine  Aged Out  *Topic was postponed. The date shown is not the original due date.    Advanced directives: Advance directive discussed with you today. Even though you declined this today, please call our office should you change your mind, and we can give you the proper paperwork for you to fill out.   Conditions/risks identified: None  Next appointment: Follow up in one year for your annual wellness visit     Preventive Care 65 Years and Older, Female Preventive care refers to lifestyle choices and visits with your health care provider that can promote health and wellness. What does preventive care include? A yearly physical exam. This is also called an annual well check. Dental exams once or twice a year. Routine eye exams. Ask your health care provider how often you should have your eyes checked. Personal lifestyle choices, including: Daily care of your teeth and gums. Regular physical activity. Eating a healthy diet. Avoiding tobacco and drug use. Limiting alcohol  use. Practicing safe sex. Taking low-dose aspirin every day. Taking vitamin and mineral supplements as recommended by your health care provider. What happens during an annual well check? The services and screenings done by your health care provider during your annual well check will depend on your age, overall health, lifestyle risk factors, and family history of disease. Counseling  Your health care provider may ask you questions about your: Alcohol use. Tobacco use. Drug use. Emotional well-being. Home and relationship well-being. Sexual activity. Eating habits. History of falls. Memory and ability to understand (cognition). Work and work Astronomer. Reproductive health. Screening  You may have the following tests or measurements: Height, weight, and BMI. Blood pressure. Lipid and cholesterol levels. These may be checked every 5 years, or more frequently if you are over 25 years old. Skin check. Lung cancer screening. You may have this screening every year starting at age 65 if you have a 30-pack-year history of smoking and currently smoke or have quit within the past 15 years. Fecal occult blood test (FOBT) of the stool. You may have this test every year starting at age 73. Flexible sigmoidoscopy or colonoscopy. You may have a sigmoidoscopy every 5 years or a colonoscopy every 10 years starting at age 102. Hepatitis C blood test. Hepatitis B blood test. Sexually transmitted disease (STD) testing. Diabetes screening. This is done by checking your blood sugar (glucose) after you  have not eaten for a while (fasting). You may have this done every 1-3 years. Bone density scan. This is done to screen for osteoporosis. You may have this done starting at age 15. Mammogram. This may be done every 1-2 years. Talk to your health care provider about how often you should have regular mammograms. Talk with your health care provider about your test results, treatment options, and if necessary,  the need for more tests. Vaccines  Your health care provider may recommend certain vaccines, such as: Influenza vaccine. This is recommended every year. Tetanus, diphtheria, and acellular pertussis (Tdap, Td) vaccine. You may need a Td booster every 10 years. Zoster vaccine. You may need this after age 64. Pneumococcal 13-valent conjugate (PCV13) vaccine. One dose is recommended after age 60. Pneumococcal polysaccharide (PPSV23) vaccine. One dose is recommended after age 9. Talk to your health care provider about which screenings and vaccines you need and how often you need them. This information is not intended to replace advice given to you by your health care provider. Make sure you discuss any questions you have with your health care provider. Document Released: 05/21/2015 Document Revised: 01/12/2016 Document Reviewed: 02/23/2015 Elsevier Interactive Patient Education  2017 [employer].  Fall Prevention in the Home Falls can cause injuries. They can happen to people of all ages. There are many things you can do to make your home safe and to help prevent falls. What can I do on the outside of my home? Regularly fix the edges of walkways and driveways and fix any cracks. Remove anything that might make you trip as you walk through a door, such as a raised step or threshold. Trim any bushes or trees on the path to your home. Use bright outdoor lighting. Clear any walking paths of anything that might make someone trip, such as rocks or tools. Regularly check to see if handrails are loose or broken. Make sure that both sides of any steps have handrails. Any raised decks and porches should have guardrails on the edges. Have any leaves, snow, or ice cleared regularly. Use sand or salt on walking paths during winter. Clean up any spills in your garage right away. This includes oil or grease spills. What can I do in the bathroom? Use night lights. Install grab bars by the toilet and in the  tub and shower. Do not use towel bars as grab bars. Use non-skid mats or decals in the tub or shower. If you need to sit down in the shower, use a plastic, non-slip stool. Keep the floor dry. Clean up any water that spills on the floor as soon as it happens. Remove soap buildup in the tub or shower regularly. Attach bath mats securely with double-sided non-slip rug tape. Do not have throw rugs and other things on the floor that can make you trip. What can I do in the bedroom? Use night lights. Make sure that you have a light by your bed that is easy to reach. Do not use any sheets or blankets that are too big for your bed. They should not hang down onto the floor. Have a firm chair that has side arms. You can use this for support while you get dressed. Do not have throw rugs and other things on the floor that can make you trip. What can I do in the kitchen? Clean up any spills right away. Avoid walking on wet floors. Keep items that you use a lot in easy-to-reach places. If you need  to reach something above you, use a strong step stool that has a grab bar. Keep electrical cords out of the way. Do not use floor polish or wax that makes floors slippery. If you must use wax, use non-skid floor wax. Do not have throw rugs and other things on the floor that can make you trip. What can I do with my stairs? Do not leave any items on the stairs. Make sure that there are handrails on both sides of the stairs and use them. Fix handrails that are broken or loose. Make sure that handrails are as long as the stairways. Check any carpeting to make sure that it is firmly attached to the stairs. Fix any carpet that is loose or worn. Avoid having throw rugs at the top or bottom of the stairs. If you do have throw rugs, attach them to the floor with carpet tape. Make sure that you have a light switch at the top of the stairs and the bottom of the stairs. If you do not have them, ask someone to add them for  you. What else can I do to help prevent falls? Wear shoes that: Do not have high heels. Have rubber bottoms. Are comfortable and fit you well. Are closed at the toe. Do not wear sandals. If you use a stepladder: Make sure that it is fully opened. Do not climb a closed stepladder. Make sure that both sides of the stepladder are locked into place. Ask someone to hold it for you, if possible. Clearly mark and make sure that you can see: Any grab bars or handrails. First and last steps. Where the edge of each step is. Use tools that help you move around (mobility aids) if they are needed. These include: Canes. Walkers. Scooters. Crutches. Turn on the lights when you go into a dark area. Replace any light bulbs as soon as they burn out. Set up your furniture so you have a clear path. Avoid moving your furniture around. If any of your floors are uneven, fix them. If there are any pets around you, be aware of where they are. Review your medicines with your doctor. Some medicines can make you feel dizzy. This can increase your chance of falling. Ask your doctor what other things that you can do to help prevent falls. This information is not intended to replace advice given to you by your health care provider. Make sure you discuss any questions you have with your health care provider. Document Released: 02/18/2009 Document Revised: 09/30/2015 Document Reviewed: 05/29/2014 Elsevier Interactive Patient Education  2017 [employer].

## 2022-09-29 NOTE — Progress Notes (Signed)
Subjective:   Shelly Arnold is a 72 y.o. female who presents for Medicare Annual (Subsequent) preventive examination.  Review of Systems     Cardiac Risk Factors include: advanced age (>50men, >49 women)     Objective:    Today's Vitals   09/29/22 0840  BP: 122/62  Pulse: 76  Temp: 98.1 F (36.7 C)  TempSrc: Oral  SpO2: 97%  Weight: 122 lb 11.2 oz (55.7 kg)  Height: 5\' 2"  (1.575 m)   Body mass index is 22.44 kg/m.     09/29/2022    9:02 AM 09/26/2021    9:58 AM  Advanced Directives  Does Patient Have a Medical Advance Directive? No No  Would patient like information on creating a medical advance directive? No - Patient declined No - Patient declined    Current Medications (verified) Outpatient Encounter Medications as of 09/29/2022  Medication Sig   calcium gluconate 500 MG tablet Take 1 tablet by mouth daily.   co-enzyme Q-10 30 MG capsule Take 30 mg by mouth 3 (three) times daily.   No facility-administered encounter medications on file as of 09/29/2022.    Allergies (verified) Patient has no known allergies.   History: Past Medical History:  Diagnosis Date   DDD (degenerative disc disease), lumbar    History reviewed. No pertinent surgical history. Family History  Problem Relation Age of Onset   Diabetes Mother    Diabetes Paternal Uncle    Diabetes Maternal Grandfather    Breast cancer Neg Hx    Social History   Socioeconomic History   Marital status: Single    Spouse name: Not on file   Number of children: Not on file   Years of education: Not on file   Highest education level: Not on file  Occupational History   Not on file  Tobacco Use   Smoking status: Never   Smokeless tobacco: Never  Substance and Sexual Activity   Alcohol use: Not Currently   Drug use: Never   Sexual activity: Not on file  Other Topics Concern   Not on file  Social History Narrative   Not on file   Social Determinants of Health   Financial Resource Strain:  Low Risk  (09/29/2022)   Overall Financial Resource Strain (CARDIA)    Difficulty of Paying Living Expenses: Not hard at all  Food Insecurity: No Food Insecurity (09/29/2022)   Hunger Vital Sign    Worried About Running Out of Food in the Last Year: Never true    Ran Out of Food in the Last Year: Never true  Transportation Needs: No Transportation Needs (09/29/2022)   PRAPARE - Administrator, Civil Service (Medical): No    Lack of Transportation (Non-Medical): No  Physical Activity: Insufficiently Active (09/29/2022)   Exercise Vital Sign    Days of Exercise per Week: 3 days    Minutes of Exercise per Session: 30 min  Stress: No Stress Concern Present (09/29/2022)   [employer] of Occupational Health - Occupational Stress Questionnaire    Feeling of Stress : Not at all  Social Connections: Socially Isolated (09/29/2022)   Social Connection and Isolation Panel [NHANES]    Frequency of Communication with Friends and Family: More than three times a week    Frequency of Social Gatherings with Friends and Family: More than three times a week    Attends Religious Services: Never    Database administrator or Organizations: No    Attends Club or  Organization Meetings: Never    Marital Status: Never married    Tobacco Counseling Counseling given: Not Answered   Clinical Intake:  Pre-visit preparation completed: Yes  Pain : No/denies pain     BMI - recorded: 22.44 Nutritional Status: BMI of 19-24  Normal Nutritional Risks: None Diabetes: No  How often do you need to have someone help you when you read instructions, pamphlets, or other written materials from your doctor or pharmacy?: 1 - Never  Diabetic?  No  Interpreter Needed?: No  Information entered by :: Theresa Mulligan LPN   Activities of Daily Living    09/29/2022    8:59 AM 09/25/2022   12:29 PM  In your present state of health, do you have any difficulty performing the following activities:  Hearing?  0 0  Vision? 0 0  Difficulty concentrating or making decisions? 0 0  Walking or climbing stairs? 1 1  Comment Due to knee issues. Not followed by medical attention   Dressing or bathing? 0 0  Doing errands, shopping? 0 0  Preparing Food and eating ? N N  Using the Toilet? N N  In the past six months, have you accidently leaked urine? N Y  Do you have problems with loss of bowel control? N N  Managing your Medications? N N  Managing your Finances? N N  Housekeeping or managing your Housekeeping? [name] Y    Patient Care Team: Philip Aspen, Limmie Patricia, MD as PCP - General (Internal Medicine)  Indicate any recent Medical Services you may have received from other than [facility] providers in the past year (date may be approximate).     Assessment:   This is a routine wellness examination for Shelly Arnold.  Hearing/Vision screen Hearing Screening - Comments:: Denies hearing difficulties   Vision Screening - Comments:: Wears rx glasses - up to date with routine eye exams with  [facility]  Dietary issues and exercise activities discussed: Exercise limited by: None identified   Goals Addressed               This Visit's Progress     No current goals (pt-stated)         Depression Screen    09/29/2022    8:46 AM 11/29/2021   10:41 AM 09/26/2021    9:52 AM 11/25/2020    9:36 AM 10/02/2019   11:17 AM 09/23/2019   11:36 AM  PHQ 2/9 Scores  PHQ - 2 Score 0 1 0 1 0 1  PHQ- 9 Score  4  3  3     Fall Risk    09/29/2022    9:01 AM 09/25/2022   12:29 PM 11/29/2021   10:42 AM 09/26/2021    9:57 AM 09/26/2021    7:51 AM  Fall Risk   Falls in the past year? 0 0 0 0 0  Number falls in past yr: 0 0 0 0   Injury with Fall? 0  0 0   Risk for fall due to : No Fall Risks  No Fall Risks No Fall Risks   Follow up Falls prevention discussed  Falls evaluation completed      FALL RISK PREVENTION PERTAINING TO THE HOME:  Any stairs in or around the home? Yes  If so, are there any without handrails?  No  Home free of loose throw rugs in walkways, pet beds, electrical cords, etc? Yes  Adequate lighting in your home to reduce risk of falls? Yes   ASSISTIVE DEVICES UTILIZED  TO PREVENT FALLS:  Life alert? No  Use of a cane, walker or w/c? No  Grab bars in the bathroom? No  Shower chair or bench in shower? No  Elevated toilet seat or a handicapped toilet? No   TIMED UP AND GO:  Was the test performed? Yes .  Length of time to ambulate 10 feet: 10 sec.   Gait steady and fast without use of assistive device  Cognitive Function:        09/29/2022    9:02 AM 09/26/2021    9:59 AM  6CIT Screen  What Year? 0 points 0 points  What month? 0 points 0 points  What time? 0 points 0 points  Count back from 20 0 points 0 points  Months in reverse 0 points 0 points  Repeat phrase 0 points 0 points  Total Score 0 points 0 points    Immunizations Immunization History  Administered Date(s) Administered   Fluad Quad(high Dose 65+) 03/02/2021   PFIZER(Purple Top)SARS-COV-2 Vaccination 07/04/2019, 07/30/2019, 02/06/2020, 08/16/2020   PNEUMOCOCCAL CONJUGATE-20 11/25/2020   Pneumococcal-Unspecified 10/02/2018    TDAP status: Due, Education has been provided regarding the importance of this vaccine. Advised may receive this vaccine at local pharmacy or Health Dept. Aware to provide a copy of the vaccination record if obtained from local pharmacy or Health Dept. Verbalized acceptance and understanding.  Flu Vaccine status: Up to date  Pneumococcal vaccine status: Up to date  Covid-19 vaccine status: Completed vaccines  Qualifies for Shingles Vaccine? Yes   Zostavax completed No   Shingrix Completed?: No.    Education has been provided regarding the importance of this vaccine. Patient has been advised to call insurance company to determine out of pocket expense if they have not yet received this vaccine. Advised may also receive vaccine at local pharmacy or Health Dept. Verbalized  acceptance and understanding.  Screening Tests Health Maintenance  Topic Date Due   DTaP/Tdap/Td (1 - Tdap) Never done   COVID-19 Vaccine (5 - 2023-24 season) 10/15/2022 (Originally 01/06/2022)   Zoster Vaccines- Shingrix (1 of 2) 12/30/2022 (Originally 07/17/1999)   Colonoscopy  09/29/2023 (Originally 07/17/1994)   Hepatitis C Screening  09/29/2023 (Originally [birth year]1967)   INFLUENZA VACCINE  12/07/2022   Medicare Annual Wellness (AWV)  09/29/2023   MAMMOGRAM  11/19/2023   Pneumonia Vaccine 72+ Years old  Completed   DEXA SCAN  Completed   HPV VACCINES  Aged Out    Health Maintenance  Health Maintenance Due  Topic Date Due   DTaP/Tdap/Td (1 - Tdap) Never done    Colorectal cancer screening: Referral to GI placed 09/29/22. Pt aware the office will call re: appt.  Mammogram status: Completed 11/18/21. Repeat every year  Bone Density status: Completed 12/03/20. Results reflect: Bone density results: OSTEOPOROSIS. Repeat every   years.  Lung Cancer Screening: (Low Dose CT Chest recommended if Age 50-80 years, 30 pack-year currently smoking OR have quit w/in 15years.) does not qualify.     Additional Screening:  Hepatitis C Screening: does qualify; Deferred  Vision Screening: Recommended annual ophthalmology exams for early detection of glaucoma and other disorders of the eye. Is the patient up to date with their annual eye exam?  Yes  Who is the provider or what is the name of the office in which the patient attends annual eye exams? [facility] If pt is not established with a provider, would they like to be referred to a provider to establish care? No .   Dental Screening:  Recommended annual dental exams for proper oral hygiene  Community Resource Referral / Chronic Care Management:  CRR required this visit?  No   CCM required this visit?  No      Plan:     I have personally reviewed and noted the following in the patient's chart:   Medical and social history Use of  alcohol, tobacco or illicit drugs  Current medications and supplements including opioid prescriptions. Patient is not currently taking opioid prescriptions. Functional ability and status Nutritional status Physical activity Advanced directives List of other physicians Hospitalizations, surgeries, and ER visits in previous 12 months Vitals Screenings to include cognitive, depression, and falls Referrals and appointments  In addition, I have reviewed and discussed with patient certain preventive protocols, quality metrics, and best practice recommendations. A written personalized care plan for preventive services as well as general preventive health recommendations were provided to patient.     Tillie Rung, LPN   1/61/0960   Nurse Notes: Patient due Hep-C Screening. Patient request f/u concerning a referral to Orthopedic, for continued left knee pain.

## 2022-10-03 ENCOUNTER — Other Ambulatory Visit: Payer: Self-pay | Admitting: Internal Medicine

## 2022-10-03 ENCOUNTER — Other Ambulatory Visit (INDEPENDENT_AMBULATORY_CARE_PROVIDER_SITE_OTHER): Payer: Medicare Other

## 2022-10-03 DIAGNOSIS — E559 Vitamin D deficiency, unspecified: Secondary | ICD-10-CM

## 2022-10-03 LAB — VITAMIN D 25 HYDROXY (VIT D DEFICIENCY, FRACTURES): VITD: 17.32 ng/mL — ABNORMAL LOW (ref 30.00–100.00)

## 2022-10-03 MED ORDER — VITAMIN D (ERGOCALCIFEROL) 1.25 MG (50000 UNIT) PO CAPS: 50000.0000 [IU] | ORAL_CAPSULE | ORAL | 0 refills | Status: AC

## 2023-01-25 ENCOUNTER — Other Ambulatory Visit: Payer: Self-pay | Admitting: Internal Medicine

## 2023-01-25 DIAGNOSIS — Z1231 Encounter for screening mammogram for malignant neoplasm of breast: Secondary | ICD-10-CM

## 2023-03-02 ENCOUNTER — Ambulatory Visit
Admission: RE | Admit: 2023-03-02 | Discharge: 2023-03-02 | Disposition: A | Discharge status: Home / Self Care | Payer: Medicare Other | Source: Ambulatory Visit | Attending: Internal Medicine | Admitting: Internal Medicine

## 2023-03-02 DIAGNOSIS — Z1231 Encounter for screening mammogram for malignant neoplasm of breast: Secondary | ICD-10-CM

## 2023-03-20 ENCOUNTER — Telehealth: Payer: Self-pay | Admitting: Internal Medicine

## 2023-03-20 DIAGNOSIS — Z1211 Encounter for screening for malignant neoplasm of colon: Secondary | ICD-10-CM

## 2023-03-20 NOTE — Telephone Encounter (Signed)
Referral placed.

## 2023-03-20 NOTE — Telephone Encounter (Signed)
Pt called to say she needs a referral to see Dr. Leonides Schanz for the colonoscopy.   Please call Pt back to discuss.

## 2023-03-20 NOTE — Telephone Encounter (Signed)
Okay to refer? 

## 2023-06-04 ENCOUNTER — Encounter: Payer: Self-pay | Admitting: Internal Medicine

## 2023-07-02 ENCOUNTER — Ambulatory Visit: Payer: Medicare Other | Admitting: *Deleted

## 2023-07-02 VITALS — Ht 62.0 in | Wt 128.0 lb

## 2023-07-02 DIAGNOSIS — Z1211 Encounter for screening for malignant neoplasm of colon: Secondary | ICD-10-CM

## 2023-07-02 NOTE — Progress Notes (Signed)
 Pt's name and DOB verified at the beginning of the pre-visit wit 2 identifiers  Pt denies any difficulty with ambulating,sitting, laying down or rolling side to side  Pt has no issues with ambulation   Pt has no issues moving head neck or swallowing  No egg or soy allergy known to patient   No issues known to pt with past sedation with any surgeries or procedures  Pt denies having issues being intubated  Patient denies ever being intubated  No FH of Malignant Hyperthermia  Pt is not on diet pills or shots  Pt is not on home 02   Pt is not on blood thinners   Pt denies issues with constipation   Pt is not on dialysis  Pt denise any abnormal heart rhythms   Pt denies any upcoming cardiac testing  Patient's chart reviewed by Cathlyn Parsons CNRA prior to pre-visit and patient appropriate for the [facility].  Pre-visit completed and red dot placed by patient's name on their procedure day (on provider's schedule).    Visit by phone  Pt states weight is 128 lb   IInstructions reviewed. Pt given [facility], [facility] main # and MD on call # prior to instructions.  Pt states understanding of instructions. Instructed to review again prior to procedure. Pt states they will.   Informed pt that they will receive a text or  call from [facility] regarding there prep med.

## 2023-07-17 ENCOUNTER — Encounter: Payer: Self-pay | Admitting: Certified Registered Nurse Anesthetist

## 2023-07-20 ENCOUNTER — Encounter: Payer: Medicare Other | Admitting: Internal Medicine

## 2023-09-20 ENCOUNTER — Telehealth: Payer: Self-pay | Admitting: Internal Medicine

## 2023-09-20 ENCOUNTER — Other Ambulatory Visit: Payer: Self-pay

## 2023-09-20 NOTE — Telephone Encounter (Signed)
 RN attempted call to patient to try to discern what went wrong with ensuring this patient was able to get her prep medication for her procedure with Dr. Rosaline Coma. Patient did not answer.   RN left voice message letting patient know that we would like to find out the details of what happened so we could try to determine how to better serve our patients.

## 2023-09-20 NOTE — Telephone Encounter (Signed)
 Good morning Dr. Rosaline Coma, I received a call from this patient requesting to cancel her appointment due to her not having her prep medication for the second time. I did advise patient that I could reach out to the nurse to have that prep medication sent to her pharmacy. Patient stated adamantly that she just wanted to cancel procedure. Patient was schedule to have procedure on May the 20 th. Please advise.

## 2023-09-25 ENCOUNTER — Encounter: Admitting: Internal Medicine

## 2023-11-16 ENCOUNTER — Ambulatory Visit (INDEPENDENT_AMBULATORY_CARE_PROVIDER_SITE_OTHER)

## 2023-11-16 VITALS — BP 120/60 | Temp 98.4°F | Ht 62.0 in | Wt 126.6 lb

## 2023-11-16 DIAGNOSIS — Z0000 Encounter for general adult medical examination without abnormal findings: Secondary | ICD-10-CM | POA: Diagnosis not present

## 2023-11-16 NOTE — Patient Instructions (Signed)
 Shelly Arnold , Thank you for taking time out of your busy schedule to complete your Annual Wellness Visit with me. I enjoyed our conversation and look forward to speaking with you again next year. I, as well as your care team,  appreciate your ongoing commitment to your health goals. Please review the following plan we discussed and let me know if I can assist you in the future. Your Game plan/ To Do List    Referrals: If you haven't heard from the office you've been referred to, please reach out to them at the phone provided.   Follow up Visits: Next Medicare AWV with our clinical staff: 11/21/24 @ 3:40p   Have you seen your provider in the last 6 months (3 months if uncontrolled diabetes)? 11/29/21 Next Office Visit with your provider:   Clinician Recommendations:  Aim for 30 minutes of exercise or brisk walking, 6-8 glasses of water, and 5 servings of fruits and vegetables each day.       This is a list of the screening recommended for you and due dates:  Health Maintenance  Topic Date Due   Hepatitis C Screening  Never done   DTaP/Tdap/Td vaccine (1 - Tdap) Never done   Colon Cancer Screening  Never done   Zoster (Shingles) Vaccine (1 of 2) Never done   COVID-19 Vaccine (5 - 2024-25 season) 01/07/2023   Flu Shot  12/07/2023   Medicare Annual Wellness Visit  11/15/2024   Mammogram  03/01/2025   Pneumococcal Vaccine for age over 23  Completed   DEXA scan (bone density measurement)  Completed   Hepatitis B Vaccine  Aged Out   HPV Vaccine  Aged Out   Meningitis B Vaccine  Aged Out    Advanced directives: (Provided) Advance directive discussed with you today. I have provided a copy for you to complete at home and have notarized. Once this is complete, please bring a copy in to our office so we can scan it into your chart.  Advance Care Planning is important because it:  [x]  Makes sure you receive the medical care that is consistent with your values, goals, and preferences  [x]  It  provides guidance to your family and loved ones and reduces their decisional burden about whether or not they are making the right decisions based on your wishes.  Follow the link provided in your after visit summary or read over the paperwork we have mailed to you to help you started getting your Advance Directives in place. If you need assistance in completing these, please reach out to us  so that we can help you!  See attachments for Preventive Care and Fall Prevention Tips.

## 2023-11-16 NOTE — Progress Notes (Signed)
 Subjective:   Eirene A Giese is a 73 y.o. who presents for a Medicare Wellness preventive visit.  As a reminder, Annual Wellness Visits don't include a physical exam, and some assessments may be limited, especially if this visit is performed virtually. We may recommend an in-person follow-up visit with your provider if needed.  Visit Complete: In person    Persons Participating in Visit: Patient.  AWV Questionnaire: No: Patient Medicare AWV questionnaire was not completed prior to this visit.  Cardiac Risk Factors include: advanced age (>90men, >62 women)     Objective:    Today's Vitals   11/16/23 1544  BP: 120/60  Temp: 98.4 F (36.9 C)  TempSrc: Oral  Weight: 126 lb 9.6 oz (57.4 kg)  Height: 5' 2 (1.575 m)   Body mass index is 23.16 kg/m.     11/16/2023    3:58 PM 09/29/2022    9:02 AM 09/26/2021    9:58 AM  Advanced Directives  Does Patient Have a Medical Advance Directive? No No No  Would patient like information on creating a medical advance directive? No - Patient declined No - Patient declined No - Patient declined    Current Medications (verified) Outpatient Encounter Medications as of 11/16/2023  Medication Sig   calcium gluconate 500 MG tablet Take 1 tablet by mouth daily. (Patient not taking: Reported on 07/02/2023)   co-enzyme Q-10 30 MG capsule Take 30 mg by mouth 3 (three) times daily. (Patient not taking: Reported on 07/02/2023)   Multiple Vitamin (MULTIVITAMIN) capsule Take 1 capsule by mouth daily. (Patient not taking: Reported on 11/16/2023)   No facility-administered encounter medications on file as of 11/16/2023.    Allergies (verified) Patient has no known allergies.   History: Past Medical History:  Diagnosis Date   DDD (degenerative disc disease), lumbar    Low vitamin D  level    Past Surgical History:  Procedure Laterality Date   COLONOSCOPY     Family History  Problem Relation Age of Onset   Diabetes Mother    Diabetes Paternal  Uncle    Diabetes Maternal Grandfather    Breast cancer Neg Hx    Colon cancer Neg Hx    Colon polyps Neg Hx    Esophageal cancer Neg Hx    Stomach cancer Neg Hx    Rectal cancer Neg Hx    Social History   Socioeconomic History   Marital status: Single    Spouse name: Not on file   Number of children: Not on file   Years of education: Not on file   Highest education level: Not on file  Occupational History   Not on file  Tobacco Use   Smoking status: Never   Smokeless tobacco: Never  Substance and Sexual Activity   Alcohol use: Not Currently   Drug use: Never   Sexual activity: Not on file  Other Topics Concern   Not on file  Social History Narrative   Not on file   Social Drivers of Health   Financial Resource Strain: Low Risk  (11/16/2023)   Overall Financial Resource Strain (CARDIA)    Difficulty of Paying Living Expenses: Not hard at all  Food Insecurity: No Food Insecurity (11/16/2023)   Hunger Vital Sign    Worried About Running Out of Food in the Last Year: Never true    Ran Out of Food in the Last Year: Never true  Transportation Needs: No Transportation Needs (11/16/2023)   PRAPARE - Transportation  Lack of Transportation (Medical): No    Lack of Transportation (Non-Medical): No  Physical Activity: Insufficiently Active (11/16/2023)   Exercise Vital Sign    Days of Exercise per Week: 3 days    Minutes of Exercise per Session: 30 min  Stress: No Stress Concern Present (11/16/2023)   [employer] of Occupational Health - Occupational Stress Questionnaire    Feeling of Stress: Not at all  Social Connections: Socially Isolated (11/16/2023)   Social Connection and Isolation Panel    Frequency of Communication with Friends and Family: More than three times a week    Frequency of Social Gatherings with Friends and Family: More than three times a week    Attends Religious Services: Never    Database administrator or Organizations: No    Attends Museum/gallery exhibitions officer: Never    Marital Status: Never married    Tobacco Counseling Counseling given: Not Answered    Clinical Intake:  Pre-visit preparation completed: Yes  Pain : No/denies pain     BMI - recorded: 23.16 Nutritional Status: BMI of 19-24  Normal Nutritional Risks: None Diabetes: No  Lab Results  Component Value Date   HGBA1C 6.2 11/29/2021   HGBA1C 6.1 06/07/2021   HGBA1C 6.2 11/25/2020     How often do you need to have someone help you when you read instructions, pamphlets, or other written materials from your doctor or pharmacy?: 1 - Never  Interpreter Needed?: No  Information entered by :: Rojelio Blush LPN   Activities of Daily Living      11/16/2023    3:57 PM  In your present state of health, do you have any difficulty performing the following activities:  Hearing? 0  Vision? 0  Difficulty concentrating or making decisions? 0  Walking or climbing stairs? 0  Dressing or bathing? 0  Doing errands, shopping? 0  Preparing Food and eating ? N  Using the Toilet? N  In the past six months, have you accidently leaked urine? N  Do you have problems with loss of bowel control? N  Managing your Medications? N  Managing your Finances? N  Housekeeping or managing your Housekeeping? N    Patient Care Team: Theophilus Andrews, Tully GRADE, MD as PCP - General (Internal Medicine)   I have updated your Care Teams any recent Medical Services you may have received from other providers in the past year.     Assessment:   This is a routine wellness examination for Kristen.  Hearing/Vision screen Hearing Screening - Comments:: Denies hearing difficulties   Vision Screening - Comments:: Wears rx glasses - up to date with routine eye exams with  [facility]   Goals Addressed               This Visit's Progress     Increase physical activity (pt-stated)        Remain active.       Depression Screen      11/16/2023    3:44 PM 09/29/2022     8:46 AM 11/29/2021   10:41 AM 09/26/2021    9:52 AM 11/25/2020    9:36 AM 10/02/2019   11:17 AM 09/23/2019   11:36 AM  PHQ 2/9 Scores  PHQ - 2 Score 0 0 1 0 1 0 1  PHQ- 9 Score   4  3  3     Fall Risk      11/16/2023    3:58 PM 09/29/2022    9:01 AM  09/25/2022   12:29 PM 11/29/2021   10:42 AM 09/26/2021    9:57 AM  Fall Risk   Falls in the past year? 0 0 0 0 0  Number falls in past yr: 0 0 0 0 0  Injury with Fall? 0 0  0 0  Risk for fall due to : No Fall Risks No Fall Risks  No Fall Risks No Fall Risks  Follow up Falls evaluation completed Falls prevention discussed  Falls evaluation completed       Data saved with a previous flowsheet row definition    MEDICARE RISK AT HOME:   Medicare Risk at Home Any stairs in or around the home?: Yes If so, are there any without handrails?: No Home free of loose throw rugs in walkways, pet beds, electrical cords, etc?: Yes Adequate lighting in your home to reduce risk of falls?: Yes Life alert?: No Use of a cane, walker or w/c?: No Grab bars in the bathroom?: No Shower chair or bench in shower?: No Elevated toilet seat or a handicapped toilet?: No  TIMED UP AND GO:  Was the test performed?  Yes  Length of time to ambulate 10 feet: 10 sec Gait steady and fast without use of assistive device  Cognitive Function: 6CIT completed        11/16/2023    4:00 PM 09/29/2022    9:02 AM 09/26/2021    9:59 AM  6CIT Screen  What Year? 0 points 0 points 0 points  What month? 0 points 0 points 0 points  What time? 0 points 0 points 0 points  Count back from 20 0 points 0 points 0 points  Months in reverse 0 points 0 points 0 points  Repeat phrase 0 points 0 points 0 points  Total Score 0 points 0 points 0 points    Immunizations Immunization History  Administered Date(s) Administered   Fluad Quad(high Dose 65+) 03/02/2021   PFIZER(Purple Top)SARS-COV-2 Vaccination 07/04/2019, 07/30/2019, 02/06/2020, 08/16/2020   PNEUMOCOCCAL CONJUGATE-20  11/25/2020   Pneumococcal-Unspecified 10/02/2018    Screening Tests Health Maintenance  Topic Date Due   Hepatitis C Screening  Never done   DTaP/Tdap/Td (1 - Tdap) Never done   Colonoscopy  Never done   Zoster Vaccines- Shingrix (1 of 2) Never done   COVID-19 Vaccine (5 - 2024-25 season) 01/07/2023   INFLUENZA VACCINE  12/07/2023   Medicare Annual Wellness (AWV)  11/15/2024   MAMMOGRAM  03/01/2025   Pneumococcal Vaccine: 50+ Years  Completed   DEXA SCAN  Completed   Hepatitis B Vaccines  Aged Out   HPV VACCINES  Aged Out   Meningococcal B Vaccine  Aged Out    Health Maintenance  Health Maintenance Due  Topic Date Due   Hepatitis C Screening  Never done   DTaP/Tdap/Td (1 - Tdap) Never done   Colonoscopy  Never done   Zoster Vaccines- Shingrix (1 of 2) Never done   COVID-19 Vaccine (5 - 2024-25 season) 01/07/2023   Health Maintenance Items Addressed:   Additional Screening:  Vision Screening: Recommended annual ophthalmology exams for early detection of glaucoma and other disorders of the eye. Would you like a referral to an eye doctor? No    Dental Screening: Recommended annual dental exams for proper oral hygiene  Community Resource Referral / Chronic Care Management: CRR required this visit?  No   CCM required this visit?  No   Plan:    I have personally reviewed and noted the following in  the patient's chart:   Medical and social history Use of alcohol, tobacco or illicit drugs  Current medications and supplements including opioid prescriptions. Patient is not currently taking opioid prescriptions. Functional ability and status Nutritional status Physical activity Advanced directives List of other physicians Hospitalizations, surgeries, and ER visits in previous 12 months Vitals Screenings to include cognitive, depression, and falls Referrals and appointments  In addition, I have reviewed and discussed with patient certain preventive protocols,  quality metrics, and best practice recommendations. A written personalized care plan for preventive services as well as general preventive health recommendations were provided to patient.   Rojelio LELON Blush, LPN   2/88/7974   After Visit Summary: (In Person-Printed) AVS printed and given to the patient  Notes: Nothing significant to report at this time.

## 2023-11-28 NOTE — Progress Notes (Signed)
 [facility] Quality Team Note  Name: Shelly Arnold Date of Birth: 1950/08/24 MRN: [id number] Date: 11/28/2023  [facility] Quality Team has reviewed this patient's chart, please see recommendations below:  [facility] Quality Other; (Chart reviewed for Col screening. No record found. Outreach to office contacts)

## 2024-03-17 ENCOUNTER — Other Ambulatory Visit: Payer: Self-pay | Admitting: Internal Medicine

## 2024-03-17 DIAGNOSIS — Z1231 Encounter for screening mammogram for malignant neoplasm of breast: Secondary | ICD-10-CM

## 2024-03-19 ENCOUNTER — Ambulatory Visit (INDEPENDENT_AMBULATORY_CARE_PROVIDER_SITE_OTHER): Admitting: Internal Medicine

## 2024-03-19 ENCOUNTER — Encounter: Payer: Self-pay | Admitting: Internal Medicine

## 2024-03-19 VITALS — BP 122/80 | HR 87 | Temp 98.0°F | Ht 62.0 in | Wt 130.0 lb

## 2024-03-19 DIAGNOSIS — R7302 Impaired glucose tolerance (oral): Secondary | ICD-10-CM

## 2024-03-19 DIAGNOSIS — Z0000 Encounter for general adult medical examination without abnormal findings: Secondary | ICD-10-CM

## 2024-03-19 DIAGNOSIS — Z1211 Encounter for screening for malignant neoplasm of colon: Secondary | ICD-10-CM

## 2024-03-19 DIAGNOSIS — E559 Vitamin D deficiency, unspecified: Secondary | ICD-10-CM

## 2024-03-19 DIAGNOSIS — Z1159 Encounter for screening for other viral diseases: Secondary | ICD-10-CM

## 2024-03-19 DIAGNOSIS — M25562 Pain in left knee: Secondary | ICD-10-CM

## 2024-03-19 DIAGNOSIS — G8929 Other chronic pain: Secondary | ICD-10-CM

## 2024-03-19 LAB — VITAMIN B12: Vitamin B-12: 305 pg/mL (ref 211–911)

## 2024-03-19 LAB — CBC WITH DIFFERENTIAL/PLATELET
Basophils Absolute: 0 K/uL (ref 0.0–0.1)
Basophils Relative: 0.6 % (ref 0.0–3.0)
Eosinophils Absolute: 0 K/uL (ref 0.0–0.7)
Eosinophils Relative: 0.1 % (ref 0.0–5.0)
HCT: 39.8 % (ref 36.0–46.0)
Hemoglobin: 12.9 g/dL (ref 12.0–15.0)
Lymphocytes Relative: 23.8 % (ref 12.0–46.0)
Lymphs Abs: 1.2 K/uL (ref 0.7–4.0)
MCHC: 32.4 g/dL (ref 30.0–36.0)
MCV: 80.4 fl (ref 78.0–100.0)
Monocytes Absolute: 0.5 K/uL (ref 0.1–1.0)
Monocytes Relative: 10.2 % (ref 3.0–12.0)
Neutro Abs: 3.3 K/uL (ref 1.4–7.7)
Neutrophils Relative %: 65.3 % (ref 43.0–77.0)
Platelets: 204 K/uL (ref 150.0–400.0)
RBC: 4.94 Mil/uL (ref 3.87–5.11)
RDW: 14.3 % (ref 11.5–15.5)
WBC: 5 K/uL (ref 4.0–10.5)

## 2024-03-19 LAB — VITAMIN D 25 HYDROXY (VIT D DEFICIENCY, FRACTURES): VITD: 15.72 ng/mL — ABNORMAL LOW (ref 30.00–100.00)

## 2024-03-19 LAB — COMPREHENSIVE METABOLIC PANEL WITH GFR
ALT: 9 U/L (ref 0–35)
AST: 19 U/L (ref 0–37)
Albumin: 4.6 g/dL (ref 3.5–5.2)
Alkaline Phosphatase: 58 U/L (ref 39–117)
BUN: 10 mg/dL (ref 6–23)
CO2: 26 meq/L (ref 19–32)
Calcium: 9 mg/dL (ref 8.4–10.5)
Chloride: 107 meq/L (ref 96–112)
Creatinine, Ser: 0.74 mg/dL (ref 0.40–1.20)
GFR: 79.56 mL/min (ref >60.00)
Glucose, Bld: 93 mg/dL (ref 70–99)
Potassium: 3.9 meq/L (ref 3.5–5.1)
Sodium: 142 meq/L (ref 135–145)
Total Bilirubin: 0.6 mg/dL (ref 0.2–1.2)
Total Protein: 7.6 g/dL (ref 6.0–8.3)

## 2024-03-19 LAB — LIPID PANEL
Cholesterol: 190 mg/dL (ref 0–200)
HDL: 95 mg/dL (ref >39.00)
LDL Cholesterol: 87 mg/dL (ref 0–99)
NonHDL: 95.39
Total CHOL/HDL Ratio: 2
Triglycerides: 41 mg/dL (ref 0.0–149.0)
VLDL: 8.2 mg/dL (ref 0.0–40.0)

## 2024-03-19 LAB — HEMOGLOBIN A1C: Hgb A1c MFr Bld: 6.1 % (ref 4.6–6.5)

## 2024-03-19 LAB — TSH: TSH: 1.07 u[IU]/mL (ref 0.35–5.50)

## 2024-03-19 NOTE — Progress Notes (Signed)
 Established Patient Office Visit     CC/Reason for Visit: Annual preventive exam  HPI: Shelly Arnold is a 73 y.o. female who is coming in today for the above mentioned reasons. Past Medical History is significant for: Impaired glucose tolerance and vitamin D  deficiency.  Feeling well.  She is having a flareup of her chronic knee pain and would like to be referred back to orthopedics.  She elects to defer cancer screening.  She is due for Tdap booster.  Has routine eye and dental care.   Past Medical/Surgical History: Past Medical History:  Diagnosis Date   DDD (degenerative disc disease), lumbar    Low vitamin D  level     Past Surgical History:  Procedure Laterality Date   COLONOSCOPY      Social History:  reports that she has never smoked. She has never used smokeless tobacco. She reports that she does not currently use alcohol. She reports that she does not use drugs.  Allergies: No Known Allergies  Family History:  Family History  Problem Relation Age of Onset   Diabetes Mother    Diabetes Paternal Uncle    Diabetes Maternal Grandfather    Breast cancer Neg Hx    Colon cancer Neg Hx    Colon polyps Neg Hx    Esophageal cancer Neg Hx    Stomach cancer Neg Hx    Rectal cancer Neg Hx     No current outpatient medications on file.  Review of Systems:  Negative unless indicated in HPI.   Physical Exam: Vitals:   03/19/24 1036  BP: 122/80  Pulse: 87  Temp: 98 F (36.7 C)  TempSrc: Oral  SpO2: 99%  Weight: 130 lb (59 kg)  Height: 5' 2 (1.575 m)    Body mass index is 23.78 kg/m.   Physical Exam Vitals reviewed.  Constitutional:      General: She is not in acute distress.    Appearance: Normal appearance. She is not ill-appearing, toxic-appearing or diaphoretic.  HENT:     Head: Normocephalic.     Right Ear: Tympanic membrane, ear canal and external ear normal. There is no impacted cerumen.     Left Ear: Tympanic membrane, ear canal and  external ear normal. There is no impacted cerumen.     Nose: Nose normal.     Mouth/Throat:     Mouth: Mucous membranes are moist.     Pharynx: Oropharynx is clear. No oropharyngeal exudate or posterior oropharyngeal erythema.  Eyes:     General: No scleral icterus.       Right eye: No discharge.        Left eye: No discharge.     Conjunctiva/sclera: Conjunctivae normal.     Pupils: Pupils are equal, round, and reactive to light.  Neck:     Vascular: No carotid bruit.  Cardiovascular:     Rate and Rhythm: Normal rate and regular rhythm.     Pulses: Normal pulses.     Heart sounds: Normal heart sounds.  Pulmonary:     Effort: Pulmonary effort is normal. No respiratory distress.     Breath sounds: Normal breath sounds.  Abdominal:     General: Abdomen is flat. Bowel sounds are normal.     Palpations: Abdomen is soft.  Musculoskeletal:        General: Normal range of motion.     Cervical back: Normal range of motion.  Skin:    General: Skin is warm and dry.  Neurological:     General: No focal deficit present.     Mental Status: She is alert and oriented to person, place, and time. Mental status is at baseline.  Psychiatric:        Mood and Affect: Mood normal.        Behavior: Behavior normal.        Thought Content: Thought content normal.        Judgment: Judgment normal.      Impression and Plan:  Encounter for preventive health examination  Vitamin D  deficiency -     VITAMIN D  25 Hydroxy (Vit-D Deficiency, Fractures); Future  IGT (impaired glucose tolerance) -     Hemoglobin A1c; Future -     CBC with Differential/Platelet; Future -     Comprehensive metabolic panel with GFR; Future -     Lipid panel; Future -     TSH; Future -     Vitamin B12; Future  Screening for malignant neoplasm of colon -     Ambulatory referral to Gastroenterology  Chronic pain of left knee -     Ambulatory referral to Orthopedics  Encounter for hepatitis C screening test for low  risk patient -     Hepatitis C antibody; Future   -Recommend routine eye and dental care. -Healthy lifestyle discussed in detail. -Labs to be updated today. -Prostate cancer screening: Not applicable Health Maintenance  Topic Date Due   Hepatitis C Screening  Never done   Colon Cancer Screening  Never done   Zoster (Shingles) Vaccine (1 of 2) Never done   DTaP/Tdap/Td vaccine (2 - Td or Tdap) 11/25/2018   COVID-19 Vaccine (9 - 2025-26 season) 08/29/2024   Medicare Annual Wellness Visit  11/15/2024   Breast Cancer Screening  03/01/2025   Pneumococcal Vaccine for age over 86  Completed   Flu Shot  Completed   DEXA scan (bone density measurement)  Completed   Meningitis B Vaccine  Aged Out      Keyonia Gluth Theophilus Andrews, MD [facility]

## 2024-03-20 ENCOUNTER — Ambulatory Visit: Payer: Self-pay | Admitting: Internal Medicine

## 2024-03-20 DIAGNOSIS — E559 Vitamin D deficiency, unspecified: Secondary | ICD-10-CM

## 2024-03-20 LAB — HEPATITIS C ANTIBODY: Hepatitis C Ab: NONREACTIVE

## 2024-03-20 MED ORDER — VITAMIN D (ERGOCALCIFEROL) 1.25 MG (50000 UNIT) PO CAPS: 50000.0000 [IU] | ORAL_CAPSULE | ORAL | 0 refills | Status: AC

## 2024-04-10 ENCOUNTER — Encounter: Payer: Self-pay | Admitting: Sports Medicine

## 2024-04-10 ENCOUNTER — Ambulatory Visit: Admitting: Sports Medicine

## 2024-04-10 ENCOUNTER — Other Ambulatory Visit (INDEPENDENT_AMBULATORY_CARE_PROVIDER_SITE_OTHER): Payer: Self-pay

## 2024-04-10 DIAGNOSIS — M25562 Pain in left knee: Secondary | ICD-10-CM

## 2024-04-10 DIAGNOSIS — G8929 Other chronic pain: Secondary | ICD-10-CM | POA: Diagnosis not present

## 2024-04-10 MED ORDER — METHYLPREDNISOLONE ACETATE 40 MG/ML IJ SUSP
60.0000 mg | INTRAMUSCULAR | Status: AC | PRN
  Administered 2024-04-10: 60 mg via INTRA_ARTICULAR

## 2024-04-10 MED ORDER — BUPIVACAINE HCL 0.25 % IJ SOLN
2.0000 mL | INTRAMUSCULAR | Status: AC | PRN
  Administered 2024-04-10: 2 mL via INTRA_ARTICULAR

## 2024-04-10 MED ORDER — LIDOCAINE HCL 1 % IJ SOLN
2.0000 mL | INTRAMUSCULAR | Status: AC | PRN
  Administered 2024-04-10: 2 mL

## 2024-04-10 NOTE — Progress Notes (Signed)
 [name] - 73 y.o. female MRN [id number]  Date of birth: 1950-10-15  Office Visit Note: Visit Date: 04/10/2024 PCP: Theophilus Andrews, Tully GRADE, MD Referred by: Theophilus Andrews, Estel*  Subjective: Chief Complaint  Patient presents with   Left Knee - Pain   HPI: Shelly Arnold is a pleasant 73 y.o. female who presents today for chronic left knee pain x several months.  She has had knee pain for several months. She denies any known injury or history of knee pain. She says that her pain does keep her from sleeping at night, and she gets popping and clicking in the knee. She denies having any noticeable swelling. She has not done anything to treat her pain at home aside from heat, which does not give her any relief. She describes the over the anterior (med > lat) knee when describing her pain.   Pertinent ROS were reviewed with the patient and found to be negative unless otherwise specified above in HPI.   Assessment & Plan: Visit Diagnoses:  1. Chronic pain of left knee    Plan: Impression is chronic left knee pain with insidious onset.  X-rays show no significant arthritic change nor malalignment.  She does get popping and with more medial sided joint pain, her symptomatology and exam does concern me for possible degenerative meniscal tearing.  We discussed the nature of this and treatment course.  We will get her started in formalized physical therapy to help stabilize the knee.  Okay for knee compression sleeve.  For acute pain, we discussed settling down her pain and inflammatory burden with oral anti-inflammatories or injection therapy.  She preferred corticosteroid injection, this was administered today.  Advised on postinjection protocol.  She may use ice/heat and/or short-term anti-inflammatories in the interim.  I will see her back in 6 weeks to reevaluate.  Additional considerations: Knee MRI  Follow-up: Return in about 6 weeks (around 05/22/2024) for L-knee .   Meds &  Orders: No orders of the defined types were placed in this encounter.   Orders Placed This Encounter  Procedures   Large Joint Inj: R knee   XR Knee Complete 4 Views Left   Ambulatory referral to Physical Therapy     Procedures: Large Joint Inj: L knee on 04/10/2024 4:10 PM Indications: pain and diagnostic evaluation Details: 22 G 1.5 in needle, anterolateral approach Medications: 2 mL lidocaine  1 %; 2 mL bupivacaine  0.25 %; 60 mg methylPREDNISolone  acetate 40 MG/ML Outcome: tolerated well, no immediate complications  Knee Injection, Left: After discussion on risks/benefits/indications, informed verbal consent was obtained and a timeout was performed, patient was seated on exam table. The patient's knee was prepped with Betadine and alcohol swab and utilizing anterolateral approach, the patient's knee was injected intraarticularly with 2:2:1.5 lidocaine  1%:bupivicaine 0.25%:depomedrol. Patient tolerated the procedure well without immediate complications.  Procedure, treatment alternatives, risks and benefits explained, specific risks discussed. Consent was given by the patient. Patient was prepped and draped in the usual sterile fashion.          Clinical History: No specialty comments available.  She reports that she has never smoked. She has never used smokeless tobacco.  Recent Labs    03/19/24 1053  HGBA1C 6.1    Objective:     Physical Exam  Gen: Well-appearing, in no acute distress; non-toxic CV: Well-perfused. Warm.  Resp: Breathing unlabored on room air; no wheezing. Psych: Fluid speech in conversation; appropriate affect; normal thought process  Ortho Exam - Left  knee: Trace effusion of the left knee.  Range of motion from 0-135 degrees although there is some mild discomfort with endrange flexion.  Mild medial joint line TTP.  There is pain with McMurray's testing but no reproducible clicking although guarding is present.  Negative Clark grind test and negative  patellar compression test.  Imaging: XR Knee Complete 4 Views Left Result Date: 04/10/2024 4 views of the left knee including AP standing, Rosenberg, lateral and sunrise view was ordered and reviewed by myself today.  X-rays demonstrate minimal medial tibiofemoral joint space narrowing but no significant arthritic change.  Mild lateral patellar tilt, but no significant patellofemoral arthralgia.  No acute bony fracture.   Past Medical/Family/Surgical/Social History: Medications & Allergies reviewed per EMR, new medications updated. Patient Active Problem List   Diagnosis Date Noted   Primary osteoarthritis of left shoulder 12/01/2021   IGT (impaired glucose tolerance) 11/25/2020   Vitamin D  deficiency 10/03/2019   DDD (degenerative disc disease), lumbar    Past Medical History:  Diagnosis Date   DDD (degenerative disc disease), lumbar    Low vitamin D  level    Family History  Problem Relation Age of Onset   Diabetes Mother    Diabetes Paternal Uncle    Diabetes Maternal Grandfather    Breast cancer Neg Hx    Colon cancer Neg Hx    Colon polyps Neg Hx    Esophageal cancer Neg Hx    Stomach cancer Neg Hx    Rectal cancer Neg Hx    Past Surgical History:  Procedure Laterality Date   COLONOSCOPY     Social History   Occupational History   Not on file  Tobacco Use   Smoking status: Never   Smokeless tobacco: Never  Substance and Sexual Activity   Alcohol use: Not Currently   Drug use: Never   Sexual activity: Not on file

## 2024-04-10 NOTE — Progress Notes (Signed)
 Patient says that she has had knee pain for several months. She denies any known injury or history of knee pain. She says that her pain does keep her from sleeping at night, and she gets popping and clicking in the knee. She denies having any noticeable swelling. She has not done anything to treat her pain at home aside from heat, which does not give her any relief. She points over the anterior knee when describing her pain.

## 2024-04-11 ENCOUNTER — Ambulatory Visit

## 2024-04-17 ENCOUNTER — Inpatient Hospital Stay: Admission: RE | Admit: 2024-04-17 | Discharge: 2024-04-17 | Attending: Internal Medicine | Admitting: Internal Medicine

## 2024-04-17 DIAGNOSIS — Z1231 Encounter for screening mammogram for malignant neoplasm of breast: Secondary | ICD-10-CM

## 2024-04-25 ENCOUNTER — Encounter: Payer: Self-pay | Admitting: Physical Therapy

## 2024-04-25 ENCOUNTER — Ambulatory Visit: Admitting: Physical Therapy

## 2024-04-25 DIAGNOSIS — M25552 Pain in left hip: Secondary | ICD-10-CM | POA: Diagnosis present

## 2024-04-25 DIAGNOSIS — M6281 Muscle weakness (generalized): Secondary | ICD-10-CM | POA: Insufficient documentation

## 2024-04-25 DIAGNOSIS — R2689 Other abnormalities of gait and mobility: Secondary | ICD-10-CM | POA: Insufficient documentation

## 2024-04-25 DIAGNOSIS — M25562 Pain in left knee: Secondary | ICD-10-CM | POA: Diagnosis present

## 2024-04-25 DIAGNOSIS — G8929 Other chronic pain: Secondary | ICD-10-CM | POA: Insufficient documentation

## 2024-04-25 NOTE — Therapy (Addendum)
 " OUTPATIENT PHYSICAL THERAPY LOWER EXTREMITY EVALUATION   Patient Name: Shelly Arnold MRN: [id number] DOB:Jan 12, 1951, 73 y.o., female Today's Date: 04/25/2024  END OF SESSION:  PT End of Session - 04/25/24 0933     Visit Number 1    Date for Recertification  06/20/24          Past Medical History:  Diagnosis Date   DDD (degenerative disc disease), lumbar    Low vitamin D  level    Past Surgical History:  Procedure Laterality Date   COLONOSCOPY     Patient Active Problem List   Diagnosis Date Noted   Primary osteoarthritis of left shoulder 12/01/2021   IGT (impaired glucose tolerance) 11/25/2020   Vitamin D  deficiency 10/03/2019   DDD (degenerative disc disease), lumbar     PCP: Tully Theophilus Andrews, MD  REFERRING PROVIDER: Burnetta Brunet, DO  REFERRING DIAG: [phone] (ICD-10-CM) - Chronic pain of left knee  THERAPY DIAG:  No diagnosis found.  Rationale for Evaluation and Treatment: Rehabilitation  ONSET DATE: several months ago, insidious onset  SUBJECTIVE:   SUBJECTIVE STATEMENT: Pt referred to PT for chronic Lt knee pain with insidious onset. Pain keeps her from sleeping at night, and she gets popping and clicking in the knee. Got a cortisone shot Dec 4 which helped somewhat but still hurts. Standing to cook really bothers it.    Pt has history of back pain with injection history.  PERTINENT HISTORY: Pt gets numbness with standing from anterior thigh to anterior shin across painful knee, has a history of lumbar degeneration with last MRI 2005  PAIN:  PAIN:  Are you having pain? Yes NPRS scale: 7/10 Pain location: anterior knee Pain orientation: Left and Anterior  PAIN TYPE: dull and sharp Pain description: constant  Aggravating factors: sleeping with knees touching, standing to cook, stairs (uses step to pattern) Relieving factors: cortisone shot, sitting down to get pressure out of knee   PRECAUTIONS: None  RED FLAGS: None   WEIGHT  BEARING RESTRICTIONS: No  FALLS:  Has patient fallen in last 6 months? No  LIVING ENVIRONMENT: Lives with: granddaughter Lives in: House/apartment Stairs: Yes: Internal: 16 steps; on right going up Has following equipment at home: None  OCCUPATION: retired, used to go to [employer] at [place] and walks on the track - will start back up in mid-Jan when her daughter returns to class  PLOF: Independent  PATIENT GOALS: get rid of pain, be able to stand and cook, be able to return to walking at [facility] in Jan on track, be able to sleep without pain   NEXT MD VISIT: Jan 15  OBJECTIVE:  Note: Objective measures were completed at Evaluation unless otherwise noted.  DIAGNOSTIC FINDINGS:  X-rays demonstrate  minimal medial tibiofemoral joint space narrowing but no significant  arthritic change.  Mild lateral patellar tilt, but no significant  patellofemoral arthralgia.  No acute bony fracture.  PATIENT SURVEYS:  LEFS  Extreme difficulty/unable (0), Quite a bit of difficulty (1), Moderate difficulty (2), Little difficulty (3), No difficulty (4) Survey date:  12/19  Any of your usual work, housework or school activities 3  2. Usual hobbies, recreational or sporting activities 2  3. Getting into/out of the bath 1  4. Walking between rooms 4  5. Putting on socks/shoes 2  6. Squatting  3  7. Lifting an object, like a bag of groceries from the floor 2  8. Performing light activities around your home 2  9. Performing heavy activities around your  home 1  10. Getting into/out of a car 2  11. Walking 2 blocks 4  12. Walking 1 mile 2  13. Going up/down 10 stairs (1 flight) 1  14. Standing for 1 hour 0  15.  sitting for 1 hour 2  16. Running on even ground 1  17. Running on uneven ground 1  18. Making sharp turns while running fast 1  19. Hopping  0  20. Rolling over in bed 2  Score total:  36/80     COGNITION: Overall cognitive status: Within functional limits for tasks  assessed     SENSATION: Pt reports numbness with standing along anterior thigh, anterior knee and into shin but not as far as ankle/foot  EDEMA:  Circumferential: no asymmetry across knees  MUSCLE LENGTH: Marked tightness in hamstrings bil, Lt hip flexor, Lt adductors trunk flexion 30 deg with tightness along entire posterior chain Lt>Rt  POSTURE: rounded shoulders, forward head, flexed trunk , and maintains Lt knee flexion  PALPATION: Very tight and tender: medial hamstring, adductors, distal aspect of quad, ITB distally, hip flexors, medial joint line  LOWER EXTREMITY ROM:  Active ROM Right eval Left eval  Hip flexion At least 90 deg to achieve sitting At least 90 deg to achieve sitting  Hip extension  Unable to tolerate  Hip abduction  Unable to tolerate  Hip adduction    Hip internal rotation  Unable to tolerate  Hip external rotation  Unable to tolerate  Knee flexion 130 95, pain  Knee extension 0 Lacks 8 deg  Ankle dorsiflexion    Ankle plantarflexion    Ankle inversion    Ankle eversion     (Blank rows = not tested)  LOWER EXTREMITY MMT:  MMT Right eval Left eval  Hip flexion  3+  Hip extension  3+  Hip abduction  3+  Hip adduction  3+  Hip internal rotation  3+  Hip external rotation  3+  Knee flexion  4-  Knee extension  3+  Ankle dorsiflexion  5  Ankle plantarflexion    Ankle inversion    Ankle eversion     (Blank rows = not tested)    FUNCTIONAL TESTS:  5 times sit to stand: 16.55 with nearly all effort through arms to avoid using LE  GAIT: Distance walked: within clinic Assistive device utilized: None Level of assistance: Modified independence and Total A Comments: short stride length, maintains Lt knee flexion, flexed trunk                                                                                                                                TREATMENT DATE:  04/25/24: initiated HEP, discussed need for ongoing evaluation to  determine whether lumbar spine and hip are involved    PATIENT EDUCATION:  Education details: [email or web address] Person educated: Patient Education method: Explanation, Demonstration, Verbal cues, and Handouts Education comprehension: verbalized understanding and returned demonstration  HOME  EXERCISE PROGRAM: Access Code: [email or web address] URL: [email or web address] Date: 04/25/2024 Prepared by: Orvil Zyron Deeley  Exercises - Supine Heel Slide  - 2 x daily - 7 x weekly - 2 sets - 10 reps - Seated Hamstring Stretch  - 2 x daily - 7 x weekly - 1 sets - 3 reps - 30 hold - Modified Thomas Stretch  - 2 x daily - 7 x weekly - 1 sets - 3 reps - 30 hold - Seated Long Arc Quad  - 2 x daily - 7 x weekly - 1 sets - 10 reps  ASSESSMENT:  CLINICAL IMPRESSION: Patient is a 73 y.o. female who was seen today for physical therapy evaluation and treatment for insidious onset of Lt knee pain x several months.  Pt presents with limited and painful knee ROM, weakness of Lt LE from hip to knee, marked flexibility restrictions of trunk, Lt hip and knee, and compensatory gait and transfers secondary to knee pain and weakness.  Upon questioning about sensation changes, she does describe new onset of Lt anterior thigh, knee and shin numbness accompanying her Lt knee pain with standing.  Pt has a long history of LBP with last imaging in 2005 showing multi-level degenerative changes at that time.  Pt is so painful and limited with assessment of trunk and Lt hip that it is difficult to identify whether this is a local isolated knee issue or has a more proximal involvement.  Pt will benefit from ongoing assessment and treatment to improve mobility, ROM, flexibility, strength and functional movement patterns to allow her to return to PLOF.  OBJECTIVE IMPAIRMENTS: Abnormal gait, decreased activity tolerance, decreased balance, decreased knowledge of condition, decreased mobility, difficulty walking, decreased ROM, decreased  strength, impaired flexibility, impaired sensation, impaired tone, improper body mechanics, postural dysfunction, and pain.   ACTIVITY LIMITATIONS: carrying, lifting, bending, sitting, standing, squatting, sleeping, stairs, transfers, bed mobility, bathing, toileting, and locomotion level  PARTICIPATION LIMITATIONS: meal prep, cleaning, laundry, shopping, and community activity  PERSONAL FACTORS: Time since onset of injury/illness/exacerbation are also affecting patient's functional outcome.   REHAB POTENTIAL: Excellent  CLINICAL DECISION MAKING: Evolving/moderate complexity  EVALUATION COMPLEXITY: Moderate   GOALS: Goals reviewed with patient? Yes  SHORT TERM GOALS: Target date: 05/25/24 Pt will be ind with initial HEP without exacerbation of pain Baseline: Goal status: INITIAL  2.  Pt will achieve at least 105 deg of Lt knee flexion with supine heel slide Baseline:  Goal status: INITIAL  3.  Pt will be able to tolerate Lt hip ROM assessment Baseline:  Goal status: INITIAL  4.  Pt will be able to perform sit to stand with min UE assistance demonstrating more willingness to use LE for transfers Baseline:  Goal status: INITIAL    LONG TERM GOALS: Target date: 06/20/24  Pt will be ind with advanced HEP Baseline:  Goal status: INITIAL  2.  Pt will improve LEFS score to 50/80 or better to demo improved function Baseline: 36/80 Goal status: INITIAL  3.  Pt will perform 5x STS in 13 sec or less with min use of UE to demo improved functional strength and use of LE for transfers Baseline:  Goal status: INITIAL  4.  Pt will achieve at least 120 deg of Lt knee flexion to improve stairs and squatting Baseline: 95, in supine Goal status: INITIAL  5.  Pt will be able to demo alt pattern of LE with climbing stairs to reach 2nd floor of townhome Baseline: step to pattern Goal status: INITIAL  6.  Pt will improve Lt LE flexibility to [facility] for hip and knee Baseline:  Goal  status: INITIAL   PLAN:  PT FREQUENCY: 1x/week  PT DURATION: 8 weeks  PLANNED INTERVENTIONS: 97110-Therapeutic exercises, 97530- Therapeutic activity, 97112- Neuromuscular re-education, [phone]- Self Care, [id number]- Manual therapy, [phone]- Gait training, [phone]- Aquatic Therapy, [phone]- Electrical stimulation (unattended), [phone]- Electrical stimulation (manual), [id number]- Traction (mechanical), [id number]- Ionotophoresis 4mg /ml Dexamethasone, [id number] (1-2 muscles), [id number] (3+ muscles)- Dry Needling, Patient/Family education, Balance training, Stair training, Taping, Joint mobilization, Spinal mobilization, DME instructions, Cryotherapy, and Moist heat  PLAN FOR NEXT SESSION: try NuStep, gentle knee and hip ROM, try prone femoral nerve mobs to see if this may be involved, review and progress HEP, continue with assessment to see if hip or lumbar spine may be involved, did Pt track her Lt LE numbness to give us  more info   Demarrius Guerrero, PT 04/25/2024 12:02 PM   PHYSICAL THERAPY DISCHARGE SUMMARY  Visits from Start of Care: 1  Current functional level related to goals / functional outcomes: Pt did not return for treatment visits after evaluation.  Cancelled visits secondary to financial reasons.   Remaining deficits: See above (eval only)   Education / Equipment: Initial HEP   Patient agrees to discharge. Patient goals were not met. Patient is being discharged due to financial reasons.  Khaiden Segreto, PT 05/22/24 9:42 AM    "

## 2024-05-14 ENCOUNTER — Ambulatory Visit

## 2024-05-22 ENCOUNTER — Ambulatory Visit: Admitting: Sports Medicine

## 2024-05-23 ENCOUNTER — Ambulatory Visit: Admitting: Physical Therapy

## 2024-05-28 ENCOUNTER — Ambulatory Visit

## 2024-06-06 ENCOUNTER — Ambulatory Visit: Admitting: Physical Therapy

## 2024-06-11 ENCOUNTER — Ambulatory Visit

## 2024-06-20 ENCOUNTER — Ambulatory Visit: Admitting: Physical Therapy

## 2024-11-21 ENCOUNTER — Ambulatory Visit
